# Patient Record
Sex: Male | Born: 1945 | Race: White
Health system: Northeastern US, Academic
[De-identification: ages and names within clinical notes are randomized; demographics above are authoritative.]

## PROBLEM LIST (undated history)

## (undated) DIAGNOSIS — E785 Hyperlipidemia, unspecified: Secondary | ICD-10-CM

## (undated) DIAGNOSIS — I714 Abdominal aortic aneurysm, without rupture, unspecified: Secondary | ICD-10-CM

## (undated) DIAGNOSIS — I10 Essential (primary) hypertension: Secondary | ICD-10-CM

## (undated) DIAGNOSIS — N189 Chronic kidney disease, unspecified: Secondary | ICD-10-CM

## (undated) DIAGNOSIS — K219 Gastro-esophageal reflux disease without esophagitis: Secondary | ICD-10-CM

## (undated) DIAGNOSIS — I509 Heart failure, unspecified: Secondary | ICD-10-CM

## (undated) DIAGNOSIS — J449 Chronic obstructive pulmonary disease, unspecified: Secondary | ICD-10-CM

## (undated) HISTORY — DX: Abdominal aortic aneurysm, without rupture: I71.4

## (undated) HISTORY — DX: Essential (primary) hypertension: I10

## (undated) HISTORY — DX: Abdominal aortic aneurysm, without rupture, unspecified: I71.40

## (undated) HISTORY — DX: Hyperlipidemia, unspecified: E78.5

## (undated) HISTORY — DX: Chronic kidney disease, unspecified: N18.9

## (undated) HISTORY — DX: Gastro-esophageal reflux disease without esophagitis: K21.9

## (undated) HISTORY — DX: Chronic obstructive pulmonary disease, unspecified: J44.9

## (undated) HISTORY — DX: Heart failure, unspecified: I50.9

---

## 2018-04-19 ENCOUNTER — Other Ambulatory Visit: Payer: Self-pay | Admitting: Adult Health

## 2018-04-19 DIAGNOSIS — E049 Nontoxic goiter, unspecified: Secondary | ICD-10-CM

## 2018-04-27 ENCOUNTER — Other Ambulatory Visit: Payer: Self-pay | Admitting: Adult Health

## 2018-04-27 ENCOUNTER — Ambulatory Visit
Admission: RE | Admit: 2018-04-27 | Discharge: 2018-04-27 | Disposition: A | Discharge status: Home / Self Care | Payer: Medicare Other | Source: Ambulatory Visit | Attending: Adult Health | Admitting: Adult Health

## 2018-04-27 DIAGNOSIS — E049 Nontoxic goiter, unspecified: Secondary | ICD-10-CM

## 2018-04-27 DIAGNOSIS — I714 Abdominal aortic aneurysm, without rupture, unspecified: Secondary | ICD-10-CM

## 2018-09-24 ENCOUNTER — Other Ambulatory Visit: Payer: Self-pay | Admitting: Family Medicine

## 2018-09-24 DIAGNOSIS — I719 Aortic aneurysm of unspecified site, without rupture: Secondary | ICD-10-CM

## 2018-09-24 DIAGNOSIS — I739 Peripheral vascular disease, unspecified: Secondary | ICD-10-CM

## 2018-09-27 ENCOUNTER — Other Ambulatory Visit (HOSPITAL_COMMUNITY): Payer: Self-pay | Admitting: Family Medicine

## 2018-09-27 DIAGNOSIS — I2510 Atherosclerotic heart disease of native coronary artery without angina pectoris: Secondary | ICD-10-CM

## 2018-10-08 ENCOUNTER — Ambulatory Visit (HOSPITAL_COMMUNITY)
Admission: RE | Admit: 2018-10-08 | Discharge: 2018-10-08 | Disposition: A | Discharge status: Home / Self Care | Payer: Medicare Other | Source: Ambulatory Visit | Attending: Adult Health | Admitting: Adult Health

## 2018-10-08 ENCOUNTER — Ambulatory Visit
Admission: RE | Admit: 2018-10-08 | Discharge: 2018-10-08 | Disposition: A | Discharge status: Home / Self Care | Payer: Medicare Other | Source: Ambulatory Visit | Attending: Family Medicine | Admitting: Family Medicine

## 2018-10-08 DIAGNOSIS — I739 Peripheral vascular disease, unspecified: Secondary | ICD-10-CM

## 2018-10-08 DIAGNOSIS — I119 Hypertensive heart disease without heart failure: Secondary | ICD-10-CM | POA: Insufficient documentation

## 2018-10-08 DIAGNOSIS — I2510 Atherosclerotic heart disease of native coronary artery without angina pectoris: Secondary | ICD-10-CM | POA: Diagnosis not present

## 2018-10-08 DIAGNOSIS — I719 Aortic aneurysm of unspecified site, without rupture: Secondary | ICD-10-CM

## 2018-10-08 MED ORDER — IOPAMIDOL (ISOVUE-370) INJECTION 76%
80.0000 mL | Freq: Once | INTRAVENOUS | Status: AC | PRN
  Administered 2018-10-08: 80 mL via INTRAVENOUS

## 2018-10-08 NOTE — Progress Notes (Signed)
  Echocardiogram 2D Echocardiogram has been performed.  Jennette Dubin 10/08/2018, 8:49 AM

## 2019-02-16 ENCOUNTER — Emergency Department (HOSPITAL_COMMUNITY): Payer: Medicare Other

## 2019-02-16 ENCOUNTER — Encounter (HOSPITAL_COMMUNITY): Payer: Self-pay | Admitting: Pharmacy Technician

## 2019-02-16 ENCOUNTER — Other Ambulatory Visit: Payer: Self-pay

## 2019-02-16 ENCOUNTER — Emergency Department (HOSPITAL_COMMUNITY)
Admission: EM | Admit: 2019-02-16 | Discharge: 2019-02-16 | Disposition: A | Discharge status: Home / Self Care | Payer: Medicare Other | Attending: Emergency Medicine | Admitting: Emergency Medicine

## 2019-02-16 DIAGNOSIS — R918 Other nonspecific abnormal finding of lung field: Secondary | ICD-10-CM | POA: Insufficient documentation

## 2019-02-16 DIAGNOSIS — I714 Abdominal aortic aneurysm, without rupture, unspecified: Secondary | ICD-10-CM

## 2019-02-16 DIAGNOSIS — R079 Chest pain, unspecified: Secondary | ICD-10-CM | POA: Diagnosis present

## 2019-02-16 LAB — CBC
HCT: 36.7 % — ABNORMAL LOW (ref 39.0–52.0)
Hemoglobin: 11.8 g/dL — ABNORMAL LOW (ref 13.0–17.0)
MCH: 29.9 pg (ref 26.0–34.0)
MCHC: 32.2 g/dL (ref 30.0–36.0)
MCV: 92.9 fL (ref 80.0–100.0)
Platelets: 336 10*3/uL (ref 150–400)
RBC: 3.95 MIL/uL — ABNORMAL LOW (ref 4.22–5.81)
RDW: 14.6 % (ref 11.5–15.5)
WBC: 14 10*3/uL — ABNORMAL HIGH (ref 4.0–10.5)
nRBC: 0 % (ref 0.0–0.2)

## 2019-02-16 LAB — BASIC METABOLIC PANEL
Anion gap: 14 (ref 5–15)
BUN: 25 mg/dL — ABNORMAL HIGH (ref 8–23)
CO2: 21 mmol/L — ABNORMAL LOW (ref 22–32)
Calcium: 9.6 mg/dL (ref 8.9–10.3)
Chloride: 103 mmol/L (ref 98–111)
Creatinine, Ser: 1.47 mg/dL — ABNORMAL HIGH (ref 0.61–1.24)
GFR calc Af Amer: 54 mL/min — ABNORMAL LOW (ref >60)
GFR calc non Af Amer: 47 mL/min — ABNORMAL LOW (ref >60)
Glucose, Bld: 97 mg/dL (ref 70–99)
Potassium: 4.7 mmol/L (ref 3.5–5.1)
Sodium: 138 mmol/L (ref 135–145)

## 2019-02-16 LAB — TROPONIN I: Troponin I: <0.03 ng/mL (ref <0.03)

## 2019-02-16 LAB — I-STAT CREATININE, ED: Creatinine, Ser: 1.4 mg/dL — ABNORMAL HIGH (ref 0.61–1.24)

## 2019-02-16 MED ORDER — MORPHINE SULFATE 15 MG PO TABS: 15.0000 mg | ORAL_TABLET | ORAL | 0 refills | Status: DC | PRN

## 2019-02-16 MED ORDER — IOHEXOL 350 MG/ML SOLN
100.0000 mL | Freq: Once | INTRAVENOUS | Status: AC | PRN
  Administered 2019-02-16: 100 mL via INTRAVENOUS

## 2019-02-16 MED ORDER — MORPHINE SULFATE (PF) 2 MG/ML IV SOLN
2.0000 mg | Freq: Once | INTRAVENOUS | Status: AC
  Administered 2019-02-16: 2 mg via INTRAVENOUS
  Filled 2019-02-16: qty 1

## 2019-02-16 MED ORDER — SODIUM CHLORIDE 0.9 % IV BOLUS
500.0000 mL | Freq: Once | INTRAVENOUS | Status: AC
  Administered 2019-02-16: 500 mL via INTRAVENOUS

## 2019-02-16 MED ORDER — ONDANSETRON HCL 4 MG/2ML IJ SOLN
4.0000 mg | Freq: Once | INTRAMUSCULAR | Status: AC
  Administered 2019-02-16: 16:00:00 4 mg via INTRAVENOUS
  Filled 2019-02-16: qty 2

## 2019-02-16 MED ORDER — ONDANSETRON 4 MG PO TBDP: 4.0000 mg | ORAL_TABLET | Freq: Three times a day (TID) | ORAL | 0 refills | Status: AC | PRN

## 2019-02-16 MED ORDER — SODIUM CHLORIDE 0.9% FLUSH
3.0000 mL | Freq: Once | INTRAVENOUS | Status: AC
  Administered 2019-02-16: 3 mL via INTRAVENOUS

## 2019-02-16 MED ORDER — ALUM & MAG HYDROXIDE-SIMETH 200-200-20 MG/5ML PO SUSP
15.0000 mL | Freq: Once | ORAL | Status: AC
  Administered 2019-02-16: 16:00:00 15 mL via ORAL
  Filled 2019-02-16: qty 30

## 2019-02-16 NOTE — ED Provider Notes (Signed)
[facility] EMERGENCY DEPARTMENT Provider Note   CSN: [id number] Arrival date & time: 02/16/19  1426    History   Chief Complaint Chief Complaint  Patient presents with  . Chest Pain    HPI Roberto Pham is a 73 y.o. male.     73 yo M with a chief complaint of left-sided chest pain.  Described as sharp.  Radiates to his left scapula.  Pain in his left of midline.  Worse with movement palpation twisting deep breathing.  He denies trauma.  States it hurts worse when he works in the yard.  Denies shortness of breath with this denies diaphoresis denies nausea or vomiting.  Patient has a known 7 cm AAA.  Patient states that it suprarenal.  He called his cardiologist who thought that this may be the cause of his symptoms.  Pain is been going on for about a month and worsening over the past week.  Denies trauma.  Denies cough congestion or fever.  Patient has a significant history of smoking and is trying to quit and has been smoking off and on.  The history is provided by the patient.  Chest Pain  Pain location:  L chest Pain quality: sharp and shooting   Radiates to: Left scapula. Pain severity:  Moderate Onset quality:  Gradual Timing:  Constant Progression:  Worsening Chronicity:  New Worsened by:  Deep breathing, certain positions and movement Ineffective treatments:  None tried Associated symptoms: no abdominal pain, no fever, no headache, no palpitations, no shortness of breath and no vomiting     History reviewed. No pertinent past medical history.  There are no active problems to display for this patient.   History reviewed. No pertinent surgical history.      Home Medications    Prior to Admission medications   Medication Sig Start Date End Date Taking? Authorizing Provider  morphine (MSIR) 15 MG tablet Take 1 tablet (15 mg total) by mouth every 4 (four) hours as needed for severe pain. 02/16/19   Deno Etienne, DO  ondansetron (ZOFRAN ODT) 4 MG  disintegrating tablet Take 1 tablet (4 mg total) by mouth every 8 (eight) hours as needed for nausea or vomiting. 02/16/19   Deno Etienne, DO    Family History No family history on file.  Social History Social History   Tobacco Use  . Smoking status: Not on file  Substance Use Topics  . Alcohol use: Not on file  . Drug use: Not on file     Allergies   Patient has no known allergies.   Review of Systems Review of Systems  Constitutional: Negative for chills and fever.  HENT: Negative for congestion and facial swelling.   Eyes: Negative for discharge and visual disturbance.  Respiratory: Negative for shortness of breath.   Cardiovascular: Positive for chest pain. Negative for palpitations.  Gastrointestinal: Negative for abdominal pain, diarrhea and vomiting.  Musculoskeletal: Negative for arthralgias and myalgias.  Skin: Negative for color change and rash.  Neurological: Negative for tremors, syncope and headaches.  Psychiatric/Behavioral: Negative for confusion and dysphoric mood.     Physical Exam Updated Vital Signs BP (!) 165/92   Pulse 87   Temp 98.4 F (36.9 C) (Oral)   Resp (!) 26   Ht 5\' 4"  (1.626 m)   Wt 59 kg   SpO2 95%   BMI 22.31 kg/m   Physical Exam Vitals signs and nursing note reviewed.  Constitutional:      Appearance: He is  well-developed.  HENT:     Head: Normocephalic and atraumatic.  Eyes:     Pupils: Pupils are equal, round, and reactive to light.  Neck:     Musculoskeletal: Normal range of motion and neck supple.     Vascular: No JVD.  Cardiovascular:     Rate and Rhythm: Normal rate and regular rhythm.     Heart sounds: No murmur. No friction rub. No gallop.   Pulmonary:     Effort: No respiratory distress.     Breath sounds: No wheezing.  Abdominal:     General: There is no distension.     Tenderness: There is no guarding or rebound.  Musculoskeletal: Normal range of motion.  Skin:    Coloration: Skin is not pale.      Findings: No rash.  Neurological:     Mental Status: He is alert and oriented to person, place, and time.  Psychiatric:        Behavior: Behavior normal.      ED Treatments / Results  Labs (all labs ordered are listed, but only abnormal results are displayed) Labs Reviewed  BASIC METABOLIC PANEL - Abnormal; Notable for the following components:      Result Value   CO2 21 (*)    BUN 25 (*)    Creatinine, Ser 1.47 (*)    GFR calc non Af Amer 47 (*)    GFR calc Af Amer 54 (*)    All other components within normal limits  CBC - Abnormal; Notable for the following components:   WBC 14.0 (*)    RBC 3.95 (*)    Hemoglobin 11.8 (*)    HCT 36.7 (*)    All other components within normal limits  I-STAT CREATININE, ED - Abnormal; Notable for the following components:   Creatinine, Ser 1.40 (*)    All other components within normal limits  TROPONIN I    EKG EKG Interpretation  Date/Time:  Saturday Feb 16 2019 14:41:11 EDT Ventricular Rate:  97 PR Interval:  150 QRS Duration: 76 QT Interval:  342 QTC Calculation: 434 R Axis:   78 Text Interpretation:  Normal sinus rhythm Normal ECG no STEMI, nonspecific inferior ST depression, no old comparison Confirmed by Charlesetta Shanks [phone]) on 02/16/2019 2:55:37 PM   Radiology Dg Chest 2 View  Result Date: 02/16/2019 CLINICAL DATA:  Worsening chest pain. EXAM: CHEST - 2 VIEW COMPARISON:  April 27, 2018 FINDINGS: Opacity in the posterior left upper lobe is stable, best seen on the lateral view. Scarring seen in the apices. No other acute abnormalities. IMPRESSION: Chronic changes in the lungs with no acute abnormality noted. Electronically Signed   By: Dorise Bullion III M.D   On: 02/16/2019 15:29   Ct Angio Chest/abd/pel For Dissection W And/or Wo Contrast  Result Date: 02/16/2019 CLINICAL DATA:  Thoracoabdominal aortic aneurysm EXAM: CT ANGIOGRAPHY CHEST, ABDOMEN AND PELVIS TECHNIQUE: Multidetector CT imaging through the chest, abdomen  and pelvis was performed using the standard protocol during bolus administration of intravenous contrast. Multiplanar reconstructed images and MIPs were obtained and reviewed to evaluate the vascular anatomy. CONTRAST:  155mL OMNIPAQUE IOHEXOL 350 MG/ML SOLN COMPARISON:  10/08/2018 FINDINGS: CTA CHEST FINDINGS Cardiovascular: Ectasia of the descending thoracic aorta measuring 3.1 cm maximally. Atherosclerotic irregularity. Scattered coronary artery calcifications. Heart is normal size. No filling defects in the pulmonary arteries to suggest pulmonary emboli. Mediastinum/Nodes: Enlarged left hilar lymph nodes measuring 1.4 cm. No mediastinal, right hilar or axillary adenopathy. Lungs/Pleura: Emphysema. There  is a pleural base mass noted in the posterior left upper lobe which measures 5.4 x 3.3 cm on image 24. There is associated bone destruction in the posterior left 4th rib. The mass extends inferiorly toward the left superior hilum. Findings most concerning for primary lung cancer. Biapical scarring. No effusions. Musculoskeletal: Bone destruction in the posterior left 4th rib as described above associated with the pleural base mass. Review of the MIP images confirms the above findings. CTA ABDOMEN AND PELVIS FINDINGS VASCULAR Aorta: Complex abdominal aortic aneurysm measuring 8.0 x 7.2 cm. When measured in the same planes on the prior study, this previously measures 7.5 x 7.0 cm, therefore enlarging. Extensive mural thrombus/plaque. This tapers at the aortic bifurcation where it measures 3.7 cm. Celiac: Atherosclerotic plaque at the origin with mild to moderate narrowing. SMA: Atherosclerotic plaque.  No significant stenosis. 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 Calcified plaque at the origins, left greater than right causing moderate to severe stenosis. IMA: Patent. Atherosclerotic plaque at the origin. The IMA is prominent, likely helping to supply  arterial blood flow to the left lower extremity via collaterals. Inflow: Left common iliac arteries occluded, reconstitutes through internal iliac collaterals. Veins: No obvious venous abnormality within the limitations of this arterial phase study. Review of the MIP images confirms the above findings. NON-VASCULAR Hepatobiliary: No focal hepatic abnormality. Gallbladder unremarkable. Pancreas: No focal abnormality or ductal dilatation. Spleen: No focal abnormality.  Normal size. Adrenals/Urinary Tract: Bilateral renal cysts. No hydronephrosis. Adrenal glands and urinary bladder unremarkable. Stomach/Bowel: Moderate stool throughout the colon. Stomach, large and small bowel grossly unremarkable. Lymphatic: No adenopathy. Reproductive: Mildly prominent prostate. Other: No free fluid or free air. Musculoskeletal: Degenerative changes in the lumbar spine. No acute bony abnormality. Review of the MIP images confirms the above findings. IMPRESSION: Large complex infrarenal abdominal aortic aneurysm measuring 8.0 x 7.2 cm compared with 7.5 x 7.0 cm previously. Extensive mural thrombus/plaque. Irregular posterior pleural base mass in the left upper lobe measures 5.4 x 3.3 cm with resulting bony erosion/destruction in the posterior left 4th rib, and extending toward the left superior hilum with left hilar adenopathy. Findings most compatible with primary lung cancer. This could be further evaluated with PET CT and tissue diagnosis. Emphysema.  Biapical scarring. Coronary artery disease. Moderate stool in the colon. Prostate enlargement. Electronically Signed   By: Rolm Baptise M.D.   On: 02/16/2019 16:55    Procedures Procedures (including critical care time)  Medications Ordered in ED Medications  sodium chloride flush (NS) 0.9 % injection 3 mL (3 mLs Intravenous Given 02/16/19 1602)  morphine 2 MG/ML injection 2 mg (2 mg Intravenous Given 02/16/19 1600)  ondansetron (ZOFRAN) injection 4 mg (4 mg Intravenous Given  02/16/19 1557)  alum & mag hydroxide-simeth (MAALOX/MYLANTA) 200-200-20 MG/5ML suspension 15 mL (15 mLs Oral Given 02/16/19 1556)  sodium chloride 0.9 % bolus 500 mL (500 mLs Intravenous New Bag/Given 02/16/19 1641)  iohexol (OMNIPAQUE) 350 MG/ML injection 100 mL (100 mLs Intravenous Contrast Given 02/16/19 1626)     Initial Impression / Assessment and Plan / ED Course  I have reviewed the triage vital signs and the nursing notes.  Pertinent labs & imaging results that were available during my care of the patient were reviewed by me and considered in my medical decision making (see chart for details).        73 yo M with a chief complaint of left-sided chest pain that radiates to the scapula.  History sounds most like musculoskeletal chest pain however the patient has a known 7 cm AAA.  He is concerned that this is the cause of his symptoms.  Will obtain a CT angiogram to further evaluate.  Lab work including troponin to evaluate for ACS.  The patient's troponin is negative.  The CT angiogram is concerning for slight enlarging of his AAA.  He also has a spiculated lung mass in the left lower lung.  This appears to have eroded through a few ribs which I think is most likely the cause of his pain as he has pains over that region with palpation.  He has no abdominal pain on palpation.  I discussed the case with Dr. Carlis Abbott, vascular surgery he personally reviewed the images as well as the patient's prior notes.  He felt as well that this was unlikely to be the cause of his symptoms.  He did offer to see the patient in follow-up if the patient wanted a second opinion.  I discussed this with the patient will have him follow-up with his family doctor for this lung mass work-up.  CRITICAL CARE Performed by: Cecilio Asper   Total critical care time: 35 minutes  Critical care time was exclusive of separately billable procedures and treating other patients.  Critical care was necessary to treat  or prevent imminent or life-threatening deterioration.  Critical care was time spent personally by me on the following activities: development of treatment plan with patient and/or surrogate as well as nursing, discussions with consultants, evaluation of patient's response to treatment, examination of patient, obtaining history from patient or surrogate, ordering and performing treatments and interventions, ordering and review of laboratory studies, ordering and review of radiographic studies, pulse oximetry and re-evaluation of patient's condition.   5:47 PM:  I have discussed the diagnosis/risks/treatment options with the patient and believe the pt to be eligible for discharge home to follow-up with PCP, vascular surgery. We also discussed returning to the ED immediately if new or worsening sx occur. We discussed the sx which are most concerning (e.g., sudden worsening pain, fever, inability to tolerate by mouth) that necessitate immediate return. Medications administered to the patient during their visit and any new prescriptions provided to the patient are listed below.  Medications given during this visit Medications  sodium chloride flush (NS) 0.9 % injection 3 mL (3 mLs Intravenous Given 02/16/19 1602)  morphine 2 MG/ML injection 2 mg (2 mg Intravenous Given 02/16/19 1600)  ondansetron (ZOFRAN) injection 4 mg (4 mg Intravenous Given 02/16/19 1557)  alum & mag hydroxide-simeth (MAALOX/MYLANTA) 200-200-20 MG/5ML suspension 15 mL (15 mLs Oral Given 02/16/19 1556)  sodium chloride 0.9 % bolus 500 mL (500 mLs Intravenous New Bag/Given 02/16/19 1641)  iohexol (OMNIPAQUE) 350 MG/ML injection 100 mL (100 mLs Intravenous Contrast Given 02/16/19 1626)     The patient appears reasonably screen and/or stabilized for discharge and I doubt any other medical condition or other [facility] requiring further screening, evaluation, or treatment in the ED at this time prior to discharge.    Final Clinical Impressions(s)  / ED Diagnoses   Final diagnoses:  Mass of left lung  Abdominal aortic aneurysm (AAA) greater than 5.5 cm in diameter in male [facility])    ED Discharge Orders         Ordered    morphine (MSIR) 15 MG tablet  Every 4 hours PRN     02/16/19 1733    ondansetron (ZOFRAN ODT) 4 MG disintegrating tablet  Every 8  hours PRN     02/16/19 [place], [place], DO 02/16/19 1747

## 2019-02-16 NOTE — ED Triage Notes (Signed)
Pt arrives via pov with worsening cp over the last few weeks. States pain radiates to his back shoulder blade. Hx AAA.

## 2019-02-16 NOTE — ED Notes (Signed)
Please call wife Ignacia Felling at [phone] to give her an update on her husband.

## 2019-02-16 NOTE — Discharge Instructions (Addendum)
The CT scan today shows that your aneurysm has gotten slightly larger.  I discussed the case with the vascular surgeon here who thinks that your pain is more likely to be due to the lung mass that it is to your aneurysm.  He is willing to see you in the office if you would like a second opinion.  It would also be reasonable to see the vascular surgeon that you had seen previously if you wish to discuss operative repair further.  Please call your family doctor and let them know that we saw the left lung mass that has eroded into a few of your ribs.  This could easily be the cause of your symptoms.  The radiologist is concerned for lung cancer and therefore if you would like this worked up you need to discuss it with your family doctor.    Please return to the emergency department for any worsening pain.  Pain especially to your abdomen would be concerning for pain from the aneurysm.  Take tylenol 1000mg (2 extra strength) four times a day.   Then take the pain medicine if you feel like you need it. Narcotics do not help with the pain, they only make you care about it less.  You can become addicted to this, people may break into your [employer] to steal it.  It will constipate you.  If you drive under the influence of this medicine you can get a DUI.

## 2019-02-21 ENCOUNTER — Encounter: Payer: Self-pay | Admitting: Internal Medicine

## 2019-02-21 ENCOUNTER — Telehealth: Payer: Self-pay | Admitting: Internal Medicine

## 2019-02-21 ENCOUNTER — Telehealth: Payer: Self-pay | Admitting: Medical Oncology

## 2019-02-21 NOTE — Telephone Encounter (Signed)
Patient was referred yesterday and Seth Bake will be calling him with an appt early next week.  Thanks Hinton Dyer

## 2019-02-21 NOTE — Telephone Encounter (Signed)
Asking for appt to see Dr Julien Nordmann. Recent imaging- "pleural based mass with bone destruction Left 4th rib"  Referral is in  Chart. Please call [name] [phone].

## 2019-02-21 NOTE — Telephone Encounter (Signed)
Lft voicemails on both the pt and sgo regarding an appt w/Dr. Julien Nordmann on 6/8 at 2:15pm w/labs at 1:45pm. Will mail a letter.

## 2019-02-22 ENCOUNTER — Other Ambulatory Visit: Payer: Self-pay | Admitting: *Deleted

## 2019-02-22 DIAGNOSIS — R918 Other nonspecific abnormal finding of lung field: Secondary | ICD-10-CM

## 2019-02-25 ENCOUNTER — Other Ambulatory Visit: Payer: Self-pay

## 2019-02-25 ENCOUNTER — Telehealth: Payer: Self-pay | Admitting: Medical Oncology

## 2019-02-25 ENCOUNTER — Inpatient Hospital Stay: Payer: Medicare Other | Attending: Internal Medicine | Admitting: Internal Medicine

## 2019-02-25 ENCOUNTER — Inpatient Hospital Stay: Payer: Medicare Other

## 2019-02-25 ENCOUNTER — Encounter: Payer: Self-pay | Admitting: Internal Medicine

## 2019-02-25 DIAGNOSIS — Z808 Family history of malignant neoplasm of other organs or systems: Secondary | ICD-10-CM

## 2019-02-25 DIAGNOSIS — R590 Localized enlarged lymph nodes: Secondary | ICD-10-CM

## 2019-02-25 DIAGNOSIS — R918 Other nonspecific abnormal finding of lung field: Secondary | ICD-10-CM

## 2019-02-25 DIAGNOSIS — K5900 Constipation, unspecified: Secondary | ICD-10-CM | POA: Insufficient documentation

## 2019-02-25 DIAGNOSIS — C3490 Malignant neoplasm of unspecified part of unspecified bronchus or lung: Secondary | ICD-10-CM

## 2019-02-25 DIAGNOSIS — Z801 Family history of malignant neoplasm of trachea, bronchus and lung: Secondary | ICD-10-CM

## 2019-02-25 DIAGNOSIS — F17210 Nicotine dependence, cigarettes, uncomplicated: Secondary | ICD-10-CM

## 2019-02-25 NOTE — Progress Notes (Signed)
[place] [phone]   [phone]  CONSULT NOTE  REFERRING PHYSICIAN: Dr. Deno Etienne  REASON FOR CONSULTATION:  73 years old white male with suspicious lung cancer.  HPI Roberto Pham is a 73 y.o. male with past medical history significant for abdominal aortic aneurysm, chronic kidney disease, Hypertension, dyslipidemia as well as long history for smoking.  The patient was complaining of left-sided chest pain for 2 months that was getting worse.  He was taken Tylenol with no improvement.  He presented to the emergency department for evaluation and CT angiogram scan of the chest was performed on 02/16/2019.  The scan showed a pleural-based mass noted in the posterior left lower lobe measured 5.4 x 3.3 cm associated with bone destruction and the posterior left fourth rib.  The mass extends inferiorly towards the left superior hilum and the findings are concerning for primary lung cancer.  There was also enlarged left hilar lymph node measuring 1.4 cm.  There was no mediastinal, right hilar or axillary lymphadenopathy. The patient was referred to me today for further evaluation and recommendation regarding this abnormality. When seen today he continues to have the soreness in the left side of the chest as well as shortness of breath with exertion and cough with no hemoptysis.  He has no nausea, vomiting, diarrhea but has constipation.  He denied having any significant weight loss or night sweats.  He continues to have headache with no visual changes. Family history significant for mother with esophageal cancer, sister had lung cancer and father died from massive heart attack. The patient is single and has 6 children.  He has a girlfriend Roberto Pham was available by phone during the visit.  The patient used to work as a Administrator.  He has a history for smoking up to 2 bags per day for around 60 years and unfortunately he continues to smoke.  He has no history of  alcohol or drug abuse.  HPI  Past Medical History:  Diagnosis Date  . Abdominal aortic aneurysm (AAA) ([place])   . CKD (chronic kidney disease)   . Hyperlipidemia   . Hypertension     History reviewed. No pertinent surgical history.  Family History  Problem Relation Age of Onset  . Esophageal cancer Mother   . Heart attack Father   . Lung cancer Sister     Social History Social History   Tobacco Use  . Smoking status: Current Every Day Smoker    Packs/day: 2.00    Types: Cigarettes  . Smokeless tobacco: Never Used  Substance Use Topics  . Alcohol use: Not Currently  . Drug use: Not Currently    No Known Allergies  Current Outpatient Medications  Medication Sig Dispense Refill  . ondansetron (ZOFRAN ODT) 4 MG disintegrating tablet Take 1 tablet (4 mg total) by mouth every 8 (eight) hours as needed for nausea or vomiting. 20 tablet 0   No current facility-administered medications for this visit.     Review of Systems  Constitutional: positive for fatigue Eyes: negative Ears, nose, mouth, throat, and face: negative Respiratory: positive for cough, dyspnea on exertion and pleurisy/chest pain Cardiovascular: negative Gastrointestinal: negative Genitourinary:negative Integument/breast: negative Hematologic/lymphatic: negative Musculoskeletal:negative Neurological: negative Behavioral/Psych: negative Endocrine: negative Allergic/Immunologic: negative  Physical Exam  YOV:ZCHYI, healthy, no distress, well nourished, well developed and anxious SKIN: skin color, texture, turgor are normal, no rashes or significant lesions HEAD: Normocephalic, No masses, lesions, tenderness or abnormalities EYES: normal, PERRLA, Conjunctiva are pink  and non-injected EARS: External ears normal, Canals clear OROPHARYNX:no exudate, no erythema and lips, buccal mucosa, and tongue normal  NECK: supple, no adenopathy, no JVD LYMPH:  no palpable lymphadenopathy, no hepatosplenomegaly  LUNGS: clear to auscultation , and palpation HEART: regular rate & rhythm, no murmurs and no gallops ABDOMEN:abdomen soft, non-tender, normal bowel sounds and no masses or organomegaly BACK: No CVA tenderness, Range of motion is normal EXTREMITIES:no joint deformities, effusion, or inflammation, no edema  NEURO: alert & oriented x 3 with fluent speech, no focal motor/sensory deficits  PERFORMANCE STATUS: ECOG 1  LABORATORY DATA: Lab Results  Component Value Date   WBC 14.0 (H) 02/16/2019   HGB 11.8 (L) 02/16/2019   HCT 36.7 (L) 02/16/2019   MCV 92.9 02/16/2019   PLT 336 02/16/2019      Chemistry      Component Value Date/Time   NA 138 02/16/2019 1439   K 4.7 02/16/2019 1439   CL 103 02/16/2019 1439   CO2 21 (L) 02/16/2019 1439   BUN 25 (H) 02/16/2019 1439   CREATININE 1.40 (H) 02/16/2019 1546      Component Value Date/Time   CALCIUM 9.6 02/16/2019 1439       RADIOGRAPHIC STUDIES: Dg Chest 2 View  Result Date: 02/16/2019 CLINICAL DATA:  Worsening chest pain. EXAM: CHEST - 2 VIEW COMPARISON:  April 27, 2018 FINDINGS: Opacity in the posterior left upper lobe is stable, best seen on the lateral view. Scarring seen in the apices. No other acute abnormalities. IMPRESSION: Chronic changes in the lungs with no acute abnormality noted. Electronically Signed   By: Dorise Bullion III M.D   On: 02/16/2019 15:29   Ct Angio Chest/abd/pel For Dissection W And/or Wo Contrast  Result Date: 02/16/2019 CLINICAL DATA:  Thoracoabdominal aortic aneurysm EXAM: CT ANGIOGRAPHY CHEST, ABDOMEN AND PELVIS TECHNIQUE: Multidetector CT imaging through the chest, abdomen and pelvis was performed using the standard protocol during bolus administration of intravenous contrast. Multiplanar reconstructed images and MIPs were obtained and reviewed to evaluate the vascular anatomy. CONTRAST:  132mL OMNIPAQUE IOHEXOL 350 MG/ML SOLN COMPARISON:  10/08/2018 FINDINGS: CTA CHEST FINDINGS Cardiovascular: Ectasia of  the descending thoracic aorta measuring 3.1 cm maximally. Atherosclerotic irregularity. Scattered coronary artery calcifications. Heart is normal size. No filling defects in the pulmonary arteries to suggest pulmonary emboli. Mediastinum/Nodes: Enlarged left hilar lymph nodes measuring 1.4 cm. No mediastinal, right hilar or axillary adenopathy. Lungs/Pleura: Emphysema. There is a pleural base mass noted in the posterior left upper lobe which measures 5.4 x 3.3 cm on image 24. There is associated bone destruction in the posterior left 4th rib. The mass extends inferiorly toward the left superior hilum. Findings most concerning for primary lung cancer. Biapical scarring. No effusions. Musculoskeletal: Bone destruction in the posterior left 4th rib as described above associated with the pleural base mass. Review of the MIP images confirms the above findings. CTA ABDOMEN AND PELVIS FINDINGS VASCULAR Aorta: Complex abdominal aortic aneurysm measuring 8.0 x 7.2 cm. When measured in the same planes on the prior study, this previously measures 7.5 x 7.0 cm, therefore enlarging. Extensive mural thrombus/plaque. This tapers at the aortic bifurcation where it measures 3.7 cm. Celiac: Atherosclerotic plaque at the origin with mild to moderate narrowing. SMA: Atherosclerotic plaque.  No significant stenosis. 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 Calcified plaque at the origins, left greater than right causing moderate to severe stenosis. IMA: Patent. Atherosclerotic plaque at the origin. The IMA is prominent,  likely helping to supply arterial blood flow to the left lower extremity via collaterals. Inflow: Left common iliac arteries occluded, reconstitutes through internal iliac collaterals. Veins: No obvious venous abnormality within the limitations of this arterial phase study. Review of the MIP images confirms the above findings. NON-VASCULAR Hepatobiliary: No focal  hepatic abnormality. Gallbladder unremarkable. Pancreas: No focal abnormality or ductal dilatation. Spleen: No focal abnormality.  Normal size. Adrenals/Urinary Tract: Bilateral renal cysts. No hydronephrosis. Adrenal glands and urinary bladder unremarkable. Stomach/Bowel: Moderate stool throughout the colon. Stomach, large and small bowel grossly unremarkable. Lymphatic: No adenopathy. Reproductive: Mildly prominent prostate. Other: No free fluid or free air. Musculoskeletal: Degenerative changes in the lumbar spine. No acute bony abnormality. Review of the MIP images confirms the above findings. IMPRESSION: Large complex infrarenal abdominal aortic aneurysm measuring 8.0 x 7.2 cm compared with 7.5 x 7.0 cm previously. Extensive mural thrombus/plaque. Irregular posterior pleural base mass in the left upper lobe measures 5.4 x 3.3 cm with resulting bony erosion/destruction in the posterior left 4th rib, and extending toward the left superior hilum with left hilar adenopathy. Findings most compatible with primary lung cancer. This could be further evaluated with PET CT and tissue diagnosis. Emphysema.  Biapical scarring. Coronary artery disease. Moderate stool in the colon. Prostate enlargement. Electronically Signed   By: Rolm Baptise M.D.   On: 02/16/2019 16:55    ASSESSMENT: This is a very pleasant 73 years old white male with highly suspicious stage IIIA (T3, N1, M0) lung cancer pending further staging work-up and biopsy.  He presented with pleural-based left upper lobe mass in addition to right hilar adenopathy.   PLAN: I had a lengthy discussion with the patient and his girlfriend today about his current disease condition and further investigation to confirm his diagnosis and treatment options. I recommended for the patient to complete the staging work-up by ordering a PET scan as well as MRI of the brain to rule out any metastatic disease. We will arrange for the patient to have CT-guided core biopsy  of the left upper lobe pleural-based mass for confirmation of tissue diagnosis. I will arrange for the patient to come back for follow-up visit in 2 weeks for further evaluation and recommendation regarding treatment of his condition based on the final pathology and work-up. He was strongly advised to quit smoking. The patient was also advised to call immediately if he has any other concerning symptoms in the interval. The patient voices understanding of current disease status and treatment options and is in agreement with the current care plan.  All questions were answered. The patient knows to call the clinic with any problems, questions or concerns. We can certainly see the patient much sooner if necessary.  Thank you so much for allowing me to participate in the care of Roberto Pham. I will continue to follow up the patient with you and assist in his care.  I spent 40 minutes counseling the patient face to face. The total time spent in the appointment was 60 minutes.  Disclaimer: This note was dictated with voice recognition software. Similar sounding words can inadvertently be transcribed and may not be corrected upon review.   Eilleen Kempf February 25, 2019, 2:51 PM

## 2019-02-25 NOTE — Telephone Encounter (Signed)
  Does he need labs today? 5/30-Had CBC , bmet -

## 2019-02-25 NOTE — Telephone Encounter (Signed)
NO labs today per [facility].

## 2019-02-26 ENCOUNTER — Telehealth: Payer: Self-pay | Admitting: Medical Oncology

## 2019-02-26 NOTE — Telephone Encounter (Signed)
LVM with [facility] these recommendations.

## 2019-02-26 NOTE — Telephone Encounter (Signed)
Pain

## 2019-02-26 NOTE — Telephone Encounter (Signed)
He can use Ibuprofen in addition to Hydrocodone. He may reach out to his PCP who did Rxed his pain medicine.

## 2019-02-26 NOTE — Telephone Encounter (Signed)
Pain Management-  Requests better pain management. Hydrocodone not effective for pain relief. He is taking 1 tablet every 4 hours prn as prescribed and he is taking them every 4 hours. June 1st PCP prescribed  #120 -has 42 left.

## 2019-02-27 ENCOUNTER — Telehealth: Payer: Self-pay | Admitting: Internal Medicine

## 2019-02-27 NOTE — Telephone Encounter (Signed)
Scheduled appt per 6/05 los - unable to reach pt . Left message with appt date and time

## 2019-03-04 ENCOUNTER — Other Ambulatory Visit: Payer: Self-pay

## 2019-03-04 ENCOUNTER — Ambulatory Visit (HOSPITAL_COMMUNITY)
Admission: RE | Admit: 2019-03-04 | Discharge: 2019-03-04 | Disposition: A | Discharge status: Home / Self Care | Payer: Medicare Other | Source: Ambulatory Visit | Attending: Internal Medicine | Admitting: Internal Medicine

## 2019-03-04 DIAGNOSIS — R918 Other nonspecific abnormal finding of lung field: Secondary | ICD-10-CM | POA: Diagnosis not present

## 2019-03-04 DIAGNOSIS — I700 Atherosclerosis of aorta: Secondary | ICD-10-CM | POA: Diagnosis not present

## 2019-03-04 DIAGNOSIS — I714 Abdominal aortic aneurysm, without rupture: Secondary | ICD-10-CM | POA: Insufficient documentation

## 2019-03-04 DIAGNOSIS — C3490 Malignant neoplasm of unspecified part of unspecified bronchus or lung: Secondary | ICD-10-CM | POA: Insufficient documentation

## 2019-03-04 DIAGNOSIS — J439 Emphysema, unspecified: Secondary | ICD-10-CM | POA: Insufficient documentation

## 2019-03-04 DIAGNOSIS — Z79899 Other long term (current) drug therapy: Secondary | ICD-10-CM | POA: Insufficient documentation

## 2019-03-04 LAB — GLUCOSE, CAPILLARY: Glucose-Capillary: 80 mg/dL (ref 70–99)

## 2019-03-04 IMAGING — PT NUCLEAR MEDICINE PET IMAGE INITIAL (PI) SKULL BASE TO THIGH
1 series · 6 of 6 positions shown · non-contrast
Comparison: Chest abdomen pelvis CT angiography 02/16/2019

CLINICAL DATA: Initial treatment strategy for non-small-cell lung
cancer.

EXAM:
NUCLEAR MEDICINE PET SKULL BASE TO THIGH
TECHNIQUE: 6.4 mCi F-18 FDG was injected intravenously. Full-ring PET imaging
was performed from the skull base to thigh after the radiotracer. CT
data was obtained and used for attenuation correction and anatomic
localization.
Fasting blood glucose: 80 mg/dl

[Series 1076: results mm oncology reading · 1.0mm · 0.51mm/px · 6 of 6 slices shown]
[im 1/6]
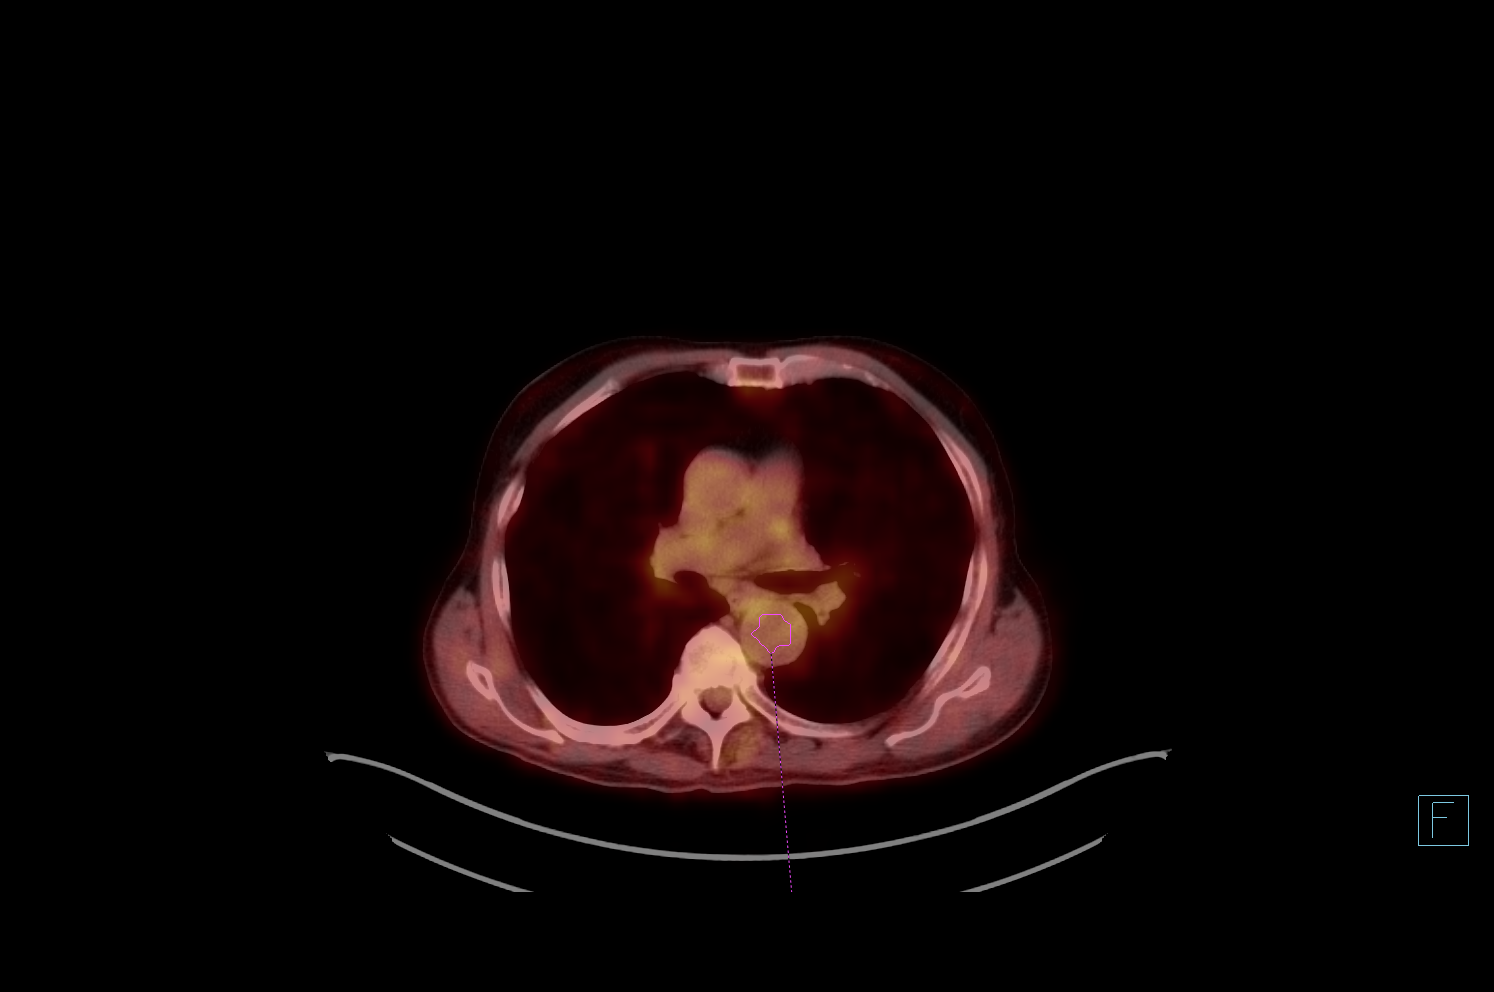
[im 2/6]
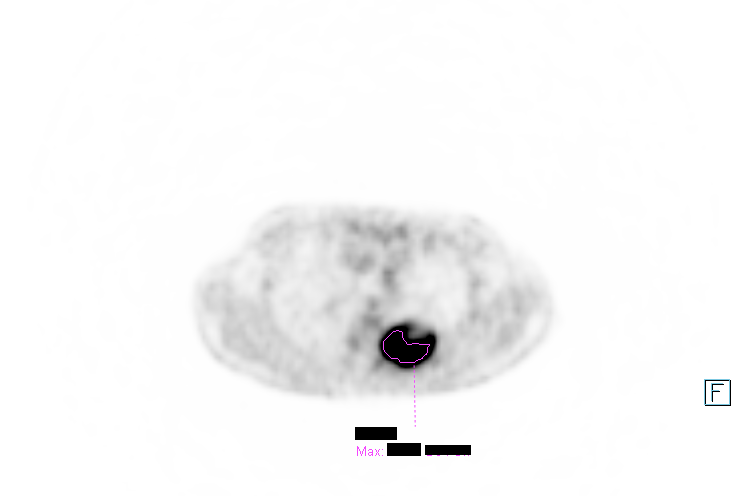
[im 3/6]
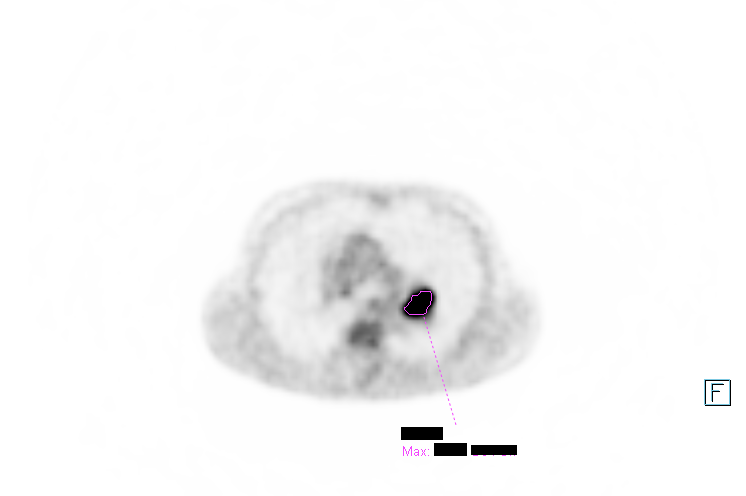
[im 4/6]
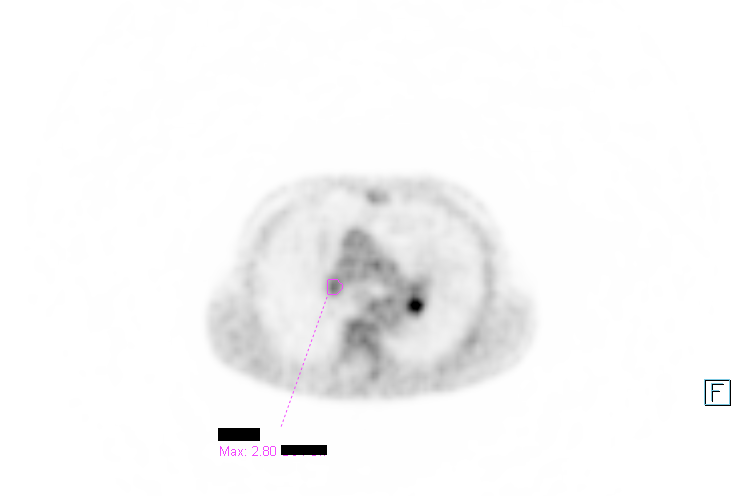
[im 5/6]
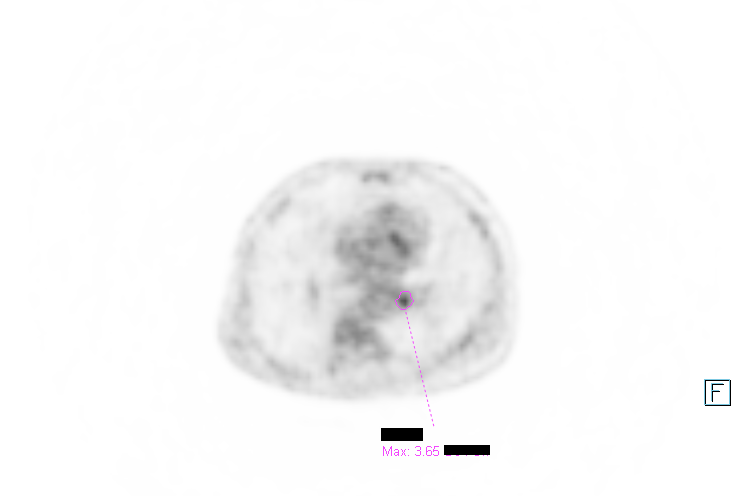
[im 6/6]
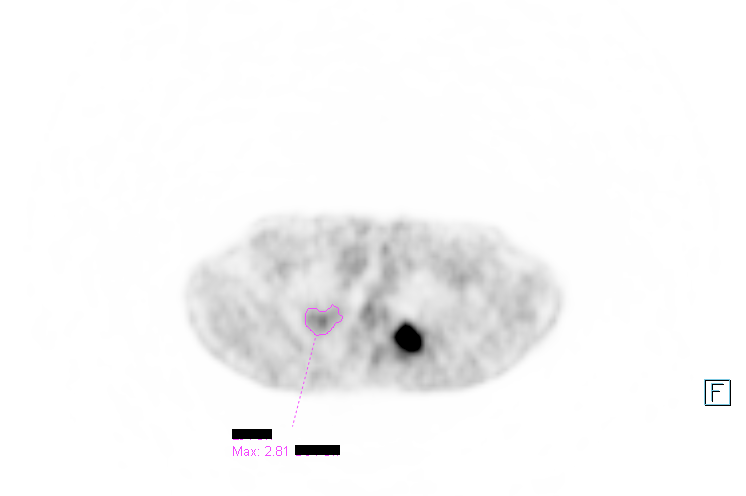

[6 of 6 positions shown; findings below may reference images not displayed]

FINDINGS: Mediastinal blood pool activity: SUV max

Liver activity: SUV max NA

NECK: No hypermetabolic lymph nodes in the neck.

Incidental CT findings: none

CHEST: Posterior left upper lobe lesion measures 5.2 x 3.7 cm today
with chest wall invasion and bony erosion of the posterior fourth
rib. There is also cortical involvement of the posterior left third
rib. Lesion is markedly hypermetabolic with SUV max = 20. Tumor
extends centrally along the major fissure with cavitary component
extending into the superior left hilum. Superior left hilar and
suprahilar disease demonstrates SUV max = 12.8. 10 mm nodule in the
inferior left hilum (84/4 and visualized on previous CT ([DATE]) is
hypermetabolic with SUV max = 3.7.

Low level activity is identified in the right hilum although
discrete lymph nodes are not evident, but features are concerning
for metastatic involvement in the right hilum.

Pleuroparenchymal opacity in the posterior right apex shows low
level FDG accumulation with SUV max = 2.8.

Incidental CT findings: Coronary artery calcification is evident.
Atherosclerotic calcification is noted in the wall of the thoracic
aorta. Centrilobular emphsyema noted.

ABDOMEN/PELVIS: No abnormal hypermetabolic activity within the
liver, pancreas, adrenal glands, or spleen. No hypermetabolic lymph
nodes in the abdomen or pelvis.

Incidental CT findings: Irregular complex infrarenal abdominal
aortic aneurysm measures 8.1 x 6.0 cm today which compares to 8.0 x
5.8 cm when I remeasure it in a similar fashion on the prior study.
Small exophytic cyst noted upper pole right kidney. Small left groin
hernia contains only fat.

SKELETON: Diffuse mottled FDG uptake noted in the bony anatomy
without definite osseous metastatic lesion evident.

Incidental CT findings: Old right transverse process fracture noted
at L1. As above, there is cortical involvement of the posterior left
third rib and destruction of the posterior left fourth rib.
IMPRESSION: 1. Markedly hypermetabolic posterior left upper lobe lung mass,
consistent with the patient's known neoplasm. Hypermetabolic soft
tissue tracks from the dominant lesion centrally into the superior
left hilum.
2. 10 mm nodule in the inferior left hilum may be a lymph node or
central pulmonary lesion. This is hypermetabolic and may reflect
metastatic disease although synchronous primary cannot be entirely
excluded. Dedicated CT chest with contrast may prove helpful to
further evaluate as clinically warranted.
3. Focal uptake in the right hilar region without underlying
discrete lymph nodes evident on CT imaging. Metastatic disease not
excluded.
4. Probable pleuroparenchymal scarring posterior right apex shows
low level FDG accumulation. As neoplasm cannot be entirely excluded,
close attention on follow-up recommended.
5. Complex infrarenal abdominal aortic aneurysm measuring up to
cm. Vascular surgery consultation recommended due to increased risk
of rupture for AAA >5.5 cm. This recommendation follows ACR
consensus guidelines: White Paper of the ACR Incidental Findings
Committee II on Vascular Findings. [HOSPITAL] [id number];
[DATE]. Aortic aneurysm NOS ([facility]-6LU.P)
6.  Aortic Atherosclerois (HHUIC-170.0)
7.  Emphysema. ([facility]-KH9.6)

## 2019-03-04 MED ORDER — GADOBUTROL 1 MMOL/ML IV SOLN
5.0000 mL | Freq: Once | INTRAVENOUS | Status: AC | PRN
  Administered 2019-03-04: 5 mL via INTRAVENOUS

## 2019-03-04 MED ORDER — FLUDEOXYGLUCOSE F - 18 (FDG) INJECTION
6.3800 | Freq: Once | INTRAVENOUS | Status: AC | PRN
  Administered 2019-03-04: 6.38 via INTRAVENOUS

## 2019-03-05 ENCOUNTER — Telehealth: Payer: Self-pay | Admitting: *Deleted

## 2019-03-05 NOTE — Telephone Encounter (Signed)
Received call from pt's significant other, [name] requesting to change pt's appt from 6/23 to 6/22 as they are moving on 6/23. Scheduling request sent

## 2019-03-06 ENCOUNTER — Telehealth: Payer: Self-pay | Admitting: Internal Medicine

## 2019-03-06 ENCOUNTER — Telehealth (HOSPITAL_COMMUNITY): Payer: Self-pay

## 2019-03-06 NOTE — Telephone Encounter (Signed)
Per 6/16 schedule message. Moved 6/23 f/u to 6/22. Confirmed with relative.

## 2019-03-08 ENCOUNTER — Encounter: Payer: Self-pay | Admitting: *Deleted

## 2019-03-08 ENCOUNTER — Telehealth (HOSPITAL_COMMUNITY): Payer: Self-pay

## 2019-03-08 NOTE — Progress Notes (Signed)
I called central scheduling to get biopsy ordered.  I was updated it is in review with the radiology team and after review, they will call patient with an appt.

## 2019-03-08 NOTE — Progress Notes (Signed)
Oncology Nurse Navigator Documentation  Oncology Nurse Navigator Flowsheets 03/08/2019  Navigator Location [facility]  Navigator Encounter Type Other/Dr. Julien Nordmann saw patient last week.  His treatment plan is work up and biopsy.  Patient has PET and MRI brain, with CT biopsy ordered.  I will reach out to radiology to get scheduled.   Abnormal Finding Date 02/16/2019  Treatment Phase Abnormal Scans  Interventions Other  Acuity Level 1  Time Spent with Patient 15

## 2019-03-11 ENCOUNTER — Inpatient Hospital Stay (HOSPITAL_BASED_OUTPATIENT_CLINIC_OR_DEPARTMENT_OTHER): Payer: Medicare Other | Admitting: Internal Medicine

## 2019-03-11 ENCOUNTER — Telehealth: Payer: Self-pay | Admitting: *Deleted

## 2019-03-11 ENCOUNTER — Other Ambulatory Visit: Payer: Self-pay

## 2019-03-11 ENCOUNTER — Encounter: Payer: Self-pay | Admitting: Internal Medicine

## 2019-03-11 VITALS — BP 123/85 | HR 87 | Temp 98.7°F | Resp 18 | Ht 64.0 in | Wt 126.6 lb

## 2019-03-11 DIAGNOSIS — R918 Other nonspecific abnormal finding of lung field: Secondary | ICD-10-CM | POA: Diagnosis not present

## 2019-03-11 DIAGNOSIS — K5900 Constipation, unspecified: Secondary | ICD-10-CM

## 2019-03-11 DIAGNOSIS — C3490 Malignant neoplasm of unspecified part of unspecified bronchus or lung: Secondary | ICD-10-CM

## 2019-03-11 NOTE — Telephone Encounter (Signed)
Received call from pt's wife. She states she has talked to her husband about the radiation Oncology consultation and he is now illing to do that. Please call his wife @ [phone] to schedule the appt.

## 2019-03-11 NOTE — Progress Notes (Signed)
[facility] [facility] [phone]   [phone]  OFFICE PROGRESS NOTE  Buzzy Han, MD [place] [id number]  DIAGNOSIS: Highly suspicious stage IIIA (T3, N1, M0) lung cancer pending further staging work-up and biopsy.  He presented with pleural-based left upper lobe mass in addition to right hilar adenopathy.  PRIOR THERAPY: None  CURRENT THERAPY: None  INTERVAL HISTORY: Roberto Pham 73 y.o. male returns to the clinic today for follow-up visit.  His wife.  Was available by phone during the visit.  The patient is feeling fine today with no concerning complaints except for pain on the left side of the chest as well as constipation that has been going on for several days.  He takes Colace with no improvement.  He denied having any shortness of breath, cough or hemoptysis.  He denied having any fever or chills.  He has no nausea, vomiting or diarrhea.  He denied having any recent weight loss or night sweats.  He had a PET scan as well as MRI of the brain performed recently and he is here for evaluation and discussion of his treatment options.  MEDICAL HISTORY: Past Medical History:  Diagnosis Date   Abdominal aortic aneurysm (AAA) (HCC)    CKD (chronic kidney disease)    Hyperlipidemia    Hypertension     ALLERGIES:  has No Known Allergies.  MEDICATIONS:  Current Outpatient Medications  Medication Sig Dispense Refill   aspirin 81 MG chewable tablet Chew 1 tablet by mouth daily.     atorvastatin (LIPITOR) 80 MG tablet Take 1 tablet by mouth daily.     carvedilol (COREG) 3.125 MG tablet Take 1 tablet by mouth 2 (two) times daily.     HYDROcodone-acetaminophen (NORCO/VICODIN) 5-325 MG tablet Take 1 tablet by mouth 4 (four) times daily as needed.     lisinopril (ZESTRIL) 30 MG tablet Take 2 tablets by mouth 2 (two) times daily.     NEXIUM 40 MG capsule Take 1 tablet by mouth daily.     ondansetron (ZOFRAN ODT) 4  MG disintegrating tablet Take 1 tablet (4 mg total) by mouth every 8 (eight) hours as needed for nausea or vomiting. 20 tablet 0   No current facility-administered medications for this visit.     SURGICAL HISTORY: No past surgical history on file.  REVIEW OF SYSTEMS:  Constitutional: positive for fatigue Eyes: negative Ears, nose, mouth, throat, and face: negative Respiratory: positive for pleurisy/chest pain Cardiovascular: negative Gastrointestinal: positive for constipation Genitourinary:negative Integument/breast: negative Hematologic/lymphatic: negative Musculoskeletal:negative Neurological: negative Behavioral/Psych: negative Endocrine: negative Allergic/Immunologic: negative   PHYSICAL EXAMINATION: General appearance: alert, cooperative, fatigued and no distress Head: Normocephalic, without obvious abnormality, atraumatic Neck: no adenopathy, no JVD, supple, symmetrical, trachea midline and thyroid not enlarged, symmetric, no tenderness/mass/nodules Lymph nodes: Cervical, supraclavicular, and axillary nodes normal. Resp: clear to auscultation bilaterally Back: symmetric, no curvature. ROM normal. No CVA tenderness. Cardio: regular rate and rhythm, S1, S2 normal, no murmur, click, rub or gallop GI: soft, non-tender; bowel sounds normal; no masses,  no organomegaly Extremities: extremities normal, atraumatic, no cyanosis or edema Neurologic: Alert and oriented X 3, normal strength and tone. Normal symmetric reflexes. Normal coordination and gait  ECOG PERFORMANCE STATUS: 1 - Symptomatic but completely ambulatory  Blood pressure 123/85, pulse 87, temperature 98.7 F (37.1 C), temperature source Oral, resp. rate 18, height 5\' 4"  (1.626 m), weight 126 lb 9.6 oz (57.4 kg), SpO2 96 %.  LABORATORY DATA: Lab Results  Component Value Date   WBC 14.0 (H) 02/16/2019   HGB 11.8 (L) 02/16/2019   HCT 36.7 (L) 02/16/2019   MCV 92.9 02/16/2019   PLT 336 02/16/2019      Chemistry       Component Value Date/Time   NA 138 02/16/2019 1439   K 4.7 02/16/2019 1439   CL 103 02/16/2019 1439   CO2 21 (L) 02/16/2019 1439   BUN 25 (H) 02/16/2019 1439   CREATININE 1.40 (H) 02/16/2019 1546      Component Value Date/Time   CALCIUM 9.6 02/16/2019 1439       RADIOGRAPHIC STUDIES: Dg Chest 2 View  Result Date: 02/16/2019 CLINICAL DATA:  Worsening chest pain. EXAM: CHEST - 2 VIEW COMPARISON:  April 27, 2018 FINDINGS: Opacity in the posterior left upper lobe is stable, best seen on the lateral view. Scarring seen in the apices. No other acute abnormalities. IMPRESSION: Chronic changes in the lungs with no acute abnormality noted. Electronically Signed   By: Dorise Bullion III M.D   On: 02/16/2019 15:29   [name] Contrast  Result Date: 03/04/2019 CLINICAL DATA:  Left lung mass. Staging of non-small cell carcinoma. EXAM: MRI HEAD WITHOUT AND WITH CONTRAST TECHNIQUE: Multiplanar, multiecho pulse sequences of the brain and surrounding structures were obtained without and with intravenous contrast. CONTRAST:  5 mL Gadavist COMPARISON:  None. FINDINGS: BRAIN: There is no acute infarct, acute hemorrhage or extra-axial collection. The midline structures are normal. No midline shift or other mass effect. Early confluent hyperintense T2-weighted signal of the periventricular and deep white matter, most commonly due to chronic ischemic microangiopathy. The cerebral and cerebellar volume are age-appropriate. No hydrocephalus. Susceptibility-sensitive sequences show no chronic microhemorrhage or superficial siderosis. No abnormal contrast enhancement. VASCULAR: The major intracranial arterial and venous sinus flow voids are normal. SKULL AND UPPER CERVICAL SPINE: Calvarial bone marrow signal is normal. There is no skull base mass. Visualized upper cervical spine and soft tissues are normal. SINUSES/ORBITS: No fluid levels or advanced mucosal thickening. No mastoid or middle ear effusion. The  orbits are normal. IMPRESSION: 1. No intracranial metastatic disease. 2. Chronic small vessel ischemia without acute abnormality. Electronically Signed   By: Ulyses Jarred M.D.   On: 03/04/2019 16:57   Nm Pet Image Initial (pi) Skull Base To Thigh  Result Date: 03/05/2019 CLINICAL DATA:  Initial treatment strategy for non-small-cell lung cancer. EXAM: NUCLEAR MEDICINE PET SKULL BASE TO THIGH TECHNIQUE: 6.4 mCi F-18 FDG was injected intravenously. Full-ring PET imaging was performed from the skull base to thigh after the radiotracer. CT data was obtained and used for attenuation correction and anatomic localization. Fasting blood glucose: 80 mg/dl COMPARISON:  Chest abdomen pelvis CT angiography 02/16/2019 FINDINGS: Mediastinal blood pool activity: SUV max 2.4 Liver activity: SUV max NA NECK: No hypermetabolic lymph nodes in the neck. Incidental CT findings: none CHEST: Posterior left upper lobe lesion measures 5.2 x 3.7 cm today with chest wall invasion and bony erosion of the posterior fourth rib. There is also cortical involvement of the posterior left third rib. Lesion is markedly hypermetabolic with SUV max = 20. Tumor extends centrally along the major fissure with cavitary component extending into the superior left hilum. Superior left hilar and suprahilar disease demonstrates SUV max = 12.8. 10 mm nodule in the inferior left hilum (84/4 and visualized on previous CT (17/5) is hypermetabolic with SUV max = 3.7. Low level activity is identified in the right hilum although discrete lymph nodes are not  evident, but features are concerning for metastatic involvement in the right hilum. Pleuroparenchymal opacity in the posterior right apex shows low level FDG accumulation with SUV max = 2.8. Incidental CT findings: Coronary artery calcification is evident. Atherosclerotic calcification is noted in the wall of the thoracic aorta. Centrilobular emphsyema noted. ABDOMEN/PELVIS: No abnormal hypermetabolic activity  within the liver, pancreas, adrenal glands, or spleen. No hypermetabolic lymph nodes in the abdomen or pelvis. Incidental CT findings: Irregular complex infrarenal abdominal aortic aneurysm measures 8.1 x 6.0 cm today which compares to 8.0 x 5.8 cm when I remeasure it in a similar fashion on the prior study. Small exophytic cyst noted upper pole right kidney. Small left groin hernia contains only fat. SKELETON: Diffuse mottled FDG uptake noted in the bony anatomy without definite osseous metastatic lesion evident. Incidental CT findings: Old right transverse process fracture noted at L1. As above, there is cortical involvement of the posterior left third rib and destruction of the posterior left fourth rib. IMPRESSION: 1. Markedly hypermetabolic posterior left upper lobe lung mass, consistent with the patient's known neoplasm. Hypermetabolic soft tissue tracks from the dominant lesion centrally into the superior left hilum. 2. 10 mm nodule in the inferior left hilum may be a lymph node or central pulmonary lesion. This is hypermetabolic and may reflect metastatic disease although synchronous primary cannot be entirely excluded. Dedicated CT chest with contrast may prove helpful to further evaluate as clinically warranted. 3. Focal uptake in the right hilar region without underlying discrete lymph nodes evident on CT imaging. Metastatic disease not excluded. 4. Probable pleuroparenchymal scarring posterior right apex shows low level FDG accumulation. As neoplasm cannot be entirely excluded, close attention on follow-up recommended. 5. Complex infrarenal abdominal aortic aneurysm measuring up to 8.1 cm. Vascular surgery consultation recommended due to increased risk of rupture for AAA >5.5 cm. This recommendation follows ACR consensus guidelines: White Paper of the ACR Incidental Findings Committee II on Vascular Findings. J Am Coll Radiol 2013; 10:789-794. Aortic aneurysm NOS (ICD10-I71.9) 6.  Aortic Atherosclerois  (ICD10-170.0) 7.  Emphysema. (XNA35-T73.9) Electronically Signed   By: Misty Stanley M.D.   On: 03/05/2019 09:59   Ct Angio Chest/abd/pel For Dissection W And/or Wo Contrast  Result Date: 02/16/2019 CLINICAL DATA:  Thoracoabdominal aortic aneurysm EXAM: CT ANGIOGRAPHY CHEST, ABDOMEN AND PELVIS TECHNIQUE: Multidetector CT imaging through the chest, abdomen and pelvis was performed using the standard protocol during bolus administration of intravenous contrast. Multiplanar reconstructed images and MIPs were obtained and reviewed to evaluate the vascular anatomy. CONTRAST:  143mL OMNIPAQUE IOHEXOL 350 MG/ML SOLN COMPARISON:  10/08/2018 FINDINGS: CTA CHEST FINDINGS Cardiovascular: Ectasia of the descending thoracic aorta measuring 3.1 cm maximally. Atherosclerotic irregularity. Scattered coronary artery calcifications. Heart is normal size. No filling defects in the pulmonary arteries to suggest pulmonary emboli. Mediastinum/Nodes: Enlarged left hilar lymph nodes measuring 1.4 cm. No mediastinal, right hilar or axillary adenopathy. Lungs/Pleura: Emphysema. There is a pleural base mass noted in the posterior left upper lobe which measures 5.4 x 3.3 cm on image 24. There is associated bone destruction in the posterior left 4th rib. The mass extends inferiorly toward the left superior hilum. Findings most concerning for primary lung cancer. Biapical scarring. No effusions. Musculoskeletal: Bone destruction in the posterior left 4th rib as described above associated with the pleural base mass. Review of the MIP images confirms the above findings. CTA ABDOMEN AND PELVIS FINDINGS VASCULAR Aorta: Complex abdominal aortic aneurysm measuring 8.0 x 7.2 cm. When measured in the same planes on  the prior study, this previously measures 7.5 x 7.0 cm, therefore enlarging. Extensive mural thrombus/plaque. This tapers at the aortic bifurcation where it measures 3.7 cm. Celiac: Atherosclerotic plaque at the origin with mild to  moderate narrowing. SMA: Atherosclerotic plaque.  No significant stenosis. 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 Calcified plaque at the origins, left greater than right causing moderate to severe stenosis. IMA: Patent. Atherosclerotic plaque at the origin. The IMA is prominent, likely helping to supply arterial blood flow to the left lower extremity via collaterals. Inflow: Left common iliac arteries occluded, reconstitutes through internal iliac collaterals. Veins: No obvious venous abnormality within the limitations of this arterial phase study. Review of the MIP images confirms the above findings. NON-VASCULAR Hepatobiliary: No focal hepatic abnormality. Gallbladder unremarkable. Pancreas: No focal abnormality or ductal dilatation. Spleen: No focal abnormality.  Normal size. Adrenals/Urinary Tract: Bilateral renal cysts. No hydronephrosis. Adrenal glands and urinary bladder unremarkable. Stomach/Bowel: Moderate stool throughout the colon. Stomach, large and small bowel grossly unremarkable. Lymphatic: No adenopathy. Reproductive: Mildly prominent prostate. Other: No free fluid or free air. Musculoskeletal: Degenerative changes in the lumbar spine. No acute bony abnormality. Review of the MIP images confirms the above findings. IMPRESSION: Large complex infrarenal abdominal aortic aneurysm measuring 8.0 x 7.2 cm compared with 7.5 x 7.0 cm previously. Extensive mural thrombus/plaque. Irregular posterior pleural base mass in the left upper lobe measures 5.4 x 3.3 cm with resulting bony erosion/destruction in the posterior left 4th rib, and extending toward the left superior hilum with left hilar adenopathy. Findings most compatible with primary lung cancer. This could be further evaluated with PET CT and tissue diagnosis. Emphysema.  Biapical scarring. Coronary artery disease. Moderate stool in the colon. Prostate enlargement. Electronically Signed   By:  Rolm Baptise M.D.   On: 02/16/2019 16:55    ASSESSMENT AND PLAN: This is a very pleasant 73 years old white male with a stage IIIA/IIIB lung cancer pending patient diagnosed and presented with left upper lobe lung mass with destruction of the posterior left fourth rib and extension towards the left superior hilum and left hilar adenopathy. I had a lengthy discussion with the patient and his wife about his current disease condition and treatment options.  I personally and independently reviewed the scan images and discussed the result and showed the images to the patient today. I recommended for the patient to have CT-guided core biopsy of the left upper lobe mass by interventional radiology. I will also refer the patient to radiation oncology for evaluation and consideration of a course of radiotherapy concurrent with chemotherapy. I will arrange for the patient a follow-up appointment with me after the biopsy for more detailed discussion of his chemotherapy option.  For constipation, the patient was advised to use milk of magnesia initially followed by MiraLAX and Colace. There was advised to call immediately if he has any other concerning symptoms in the interval. The patient voices understanding of current disease status and treatment options and is in agreement with the current care plan.  All questions were answered. The patient knows to call the clinic with any problems, questions or concerns. We can certainly see the patient much sooner if necessary.  I spent 15 minutes counseling the patient face to face. The total time spent in the appointment was 25 minutes.  Disclaimer: This note was dictated with voice recognition software. Similar sounding words can inadvertently be transcribed and may not be corrected upon review.

## 2019-03-11 NOTE — Telephone Encounter (Signed)
Called patient to schedule appointment for a consultation with Dr. Sondra Come and he refused.

## 2019-03-12 ENCOUNTER — Telehealth: Payer: Self-pay | Admitting: Internal Medicine

## 2019-03-12 ENCOUNTER — Telehealth: Payer: Self-pay | Admitting: Medical Oncology

## 2019-03-12 ENCOUNTER — Other Ambulatory Visit: Payer: Self-pay | Admitting: Internal Medicine

## 2019-03-12 ENCOUNTER — Telehealth: Payer: Self-pay | Admitting: *Deleted

## 2019-03-12 ENCOUNTER — Ambulatory Visit: Payer: Medicare Other | Admitting: Internal Medicine

## 2019-03-12 DIAGNOSIS — R918 Other nonspecific abnormal finding of lung field: Secondary | ICD-10-CM

## 2019-03-12 NOTE — Telephone Encounter (Signed)
Scheduled appt per 6/22 los - unable to reach pt . Left message with appt date and time

## 2019-03-12 NOTE — Telephone Encounter (Signed)
Oncology Nurse Navigator Documentation  Oncology Nurse Navigator Flowsheets 03/12/2019  Navigator Location [facility]  Navigator Encounter Type Other/I received a message from [place] that patient is moving to [place] on July 8 and will get treatment there.  I called and spoke with wife.  She states yes they are going.  I asked who he will be seen and she didn't know.  I asked once she knows who they will be seeing I can get records to them.  I gave her phone number to call when she has more information.  She verbalized understanding.   Abnormal Finding Date -  Treatment Phase Abnormal Scans  Interventions Other  Acuity Level 2  Time Spent with Patient 30

## 2019-03-12 NOTE — Telephone Encounter (Signed)
Pt moving on  July 8th to [place] and needs referral to MD in [place], [place] or [place].   Cancel appts-She is cancelling all his appts.and diagnostic testing "because they will do it all in [place]".

## 2019-03-12 NOTE — Telephone Encounter (Signed)
Dr. Dulcy Fanny at [facility] and [place] at [facility], [place].  I have placed the referral.

## 2019-03-13 NOTE — Telephone Encounter (Signed)
Called to [facility] [place].

## 2019-03-14 ENCOUNTER — Ambulatory Visit: Payer: Medicare Other | Admitting: Radiation Oncology

## 2019-03-14 ENCOUNTER — Ambulatory Visit: Payer: Medicare Other

## 2019-03-15 ENCOUNTER — Ambulatory Visit (HOSPITAL_COMMUNITY): Payer: Medicare Other

## 2019-03-19 ENCOUNTER — Telehealth: Payer: Self-pay | Admitting: Internal Medicine

## 2019-03-19 NOTE — Telephone Encounter (Signed)
Faxed records to Dr. Darrall Dears in [place].

## 2019-03-20 ENCOUNTER — Ambulatory Visit (HOSPITAL_COMMUNITY): Payer: Medicare Other

## 2019-03-20 ENCOUNTER — Encounter (HOSPITAL_COMMUNITY): Payer: Self-pay

## 2019-03-25 ENCOUNTER — Ambulatory Visit: Payer: Medicare Other | Admitting: Physician Assistant

## 2019-04-03 ENCOUNTER — Encounter: Payer: Self-pay | Admitting: *Deleted

## 2019-04-03 ENCOUNTER — Telehealth: Payer: Self-pay | Admitting: *Deleted

## 2019-04-03 NOTE — Telephone Encounter (Signed)
Oncology Nurse Navigator Documentation  Oncology Nurse Navigator Flowsheets 04/03/2019  Navigator Location [facility]  Navigator Encounter Type Telephone/I called Mr. Drost to follow up on how he is doing in [place] and his cancer care.  I was unable to reach but did leave a vm message for him to call with my name and phone number.   Telephone Outgoing Call  Abnormal Finding Date -  Treatment Phase Abnormal Scans  Barriers/Navigation Needs Education  Education Other  Interventions Education  Education Method Verbal  Acuity Level 1  Time Spent with Patient 15

## 2019-04-03 NOTE — Progress Notes (Signed)
Oncology Nurse Navigator Documentation  Oncology Nurse Navigator Flowsheets 04/03/2019  Navigator Location [facility]  Navigator Encounter Type Telephone/I received a call from patient's wife stating he has not been seen due to the office in [facility] has not gotten PET scan and MRI.  I called [place] where Dr. Julien Nordmann made referral on 03/12/2019.  They have Mr. Garofano in the system but not seeing anyone.  I was transferred to new patient referral office and left a vm message leaving my name and phone number to call me to help with appt for patient.    Telephone Incoming Call;Outgoing Call  Abnormal Finding Date -  Treatment Phase Abnormal Scans  Barriers/Navigation Needs Coordination of Care  Education Other  Interventions Coordination of Care  Coordination of Care Other  Education Method Verbal  Acuity Level 2  Time Spent with Patient 30

## 2019-04-04 ENCOUNTER — Other Ambulatory Visit: Payer: Self-pay | Admitting: Gastroenterology

## 2019-04-04 ENCOUNTER — Inpatient Hospital Stay: Admit: 2019-04-04 | Discharge: 2019-04-04 | Disposition: A | Discharge status: Home / Self Care | Payer: Self-pay

## 2019-04-04 ENCOUNTER — Encounter: Payer: Self-pay | Admitting: *Deleted

## 2019-04-04 NOTE — Progress Notes (Signed)
Oncology Nurse Navigator Documentation  Oncology Nurse Navigator Flowsheets 04/04/2019  Navigator Location [facility]  Navigator Encounter Type Telephone/I received a call from [facility] regarding Mr. Capri.  I was updated that they were able to speak to Mr. Whetstine and he states he lives in [place].  They have also spoken to the wife and states that they live in [place] wanted some clarification and called me.  I update that it was my understanding patient moved to [place] on 03/27/2019.  Dr. Chancy Milroy will be taking referral and has records but needs CD of scans.  I updated Dr. Julien Nordmann and he request to be sent. I notified radiology of scans needed.   Telephone Incoming Call  Abnormal Finding Date -  Treatment Phase Abnormal Scans  Barriers/Navigation Needs Coordination of Care  Education -  Interventions Coordination of Care  Coordination of Care Other  Education Method -  Acuity Level 2  Time Spent with Patient 30

## 2019-04-05 ENCOUNTER — Encounter: Payer: Self-pay | Admitting: *Deleted

## 2019-04-05 NOTE — Progress Notes (Signed)
Oncology Nurse Navigator Documentation  Oncology Nurse Navigator Flowsheets 04/05/2019  Navigator Location [facility]  Navigator Encounter Type Telephone/I received a call from [place] at [employer] at [facility] in [place] this am.  I updated her that patient's CD of his scans were Fedex yesterday.  She was thankful for the information.  She did tell me that she got a phone call from an ED 3 hours from her facility that the patient presented with coughing up blood.  The patient was told he needed a biopsy then treatment but he refused and left the hospital.  Vania Rea stated she will continue to try and get him come in to see Med Onc.    Telephone Incoming Call;Outgoing Call  Abnormal Finding Date -  Treatment Phase Abnormal Scans  Barriers/Navigation Needs Education  Education Other  Interventions Education  Coordination of Care -  Education Method Verbal  Acuity Level 1  Time Spent with Patient 15

## 2019-05-01 ENCOUNTER — Encounter: Payer: Self-pay | Admitting: Gastroenterology

## 2019-05-03 ENCOUNTER — Telehealth: Payer: Self-pay | Admitting: *Deleted

## 2019-05-03 NOTE — Telephone Encounter (Signed)
Received call from pt's girlfriend asking about pt records and CD of pt scans to be sent to [facility] in [place], [place] near [place], [place] She states that this cancer center called yesterday about records and she is following up about that call. Spoke to [place] in [place] and she states she did get that call yesterday and will be following up on that. She will provide # to Radiology so they can request CD of scans  Contact # for [facility] is [phone]  TCT [name] and made her aware of the above.

## 2019-05-11 ENCOUNTER — Other Ambulatory Visit: Payer: Self-pay

## 2019-05-14 NOTE — Patient Instructions (Addendum)
Your team:  Lovie Chol, MD, Windell Moulding, NP and your nurse is Othella Boyer, RN.      Please call the [place] Triage Phone with any questions or concerns, [phone].  This number is open 24 hrs, 7 days a week.  Your call will be routed to your nurse between 8am-5pm, and an on-call physician will return your call evenings, nights, weekends and holidays.      If you need to cancel or change an appointment, please call [phone]    Please allow five days for team to complete any disability paperwork.     SOCIAL WORK: Erin Sons [phone]    NUTRITION: Mikle Bosworth [phone]    Monday [address] Gwendolyn Fill [facility]  Tuesday [place] Gwendolyn Fill [facility]  Wednesday [address] Gwendolyn Fill [facility]  Thursday Clinic- Second Floor [address] Toney Sang        August 2020      'Sunday Monday Tuesday Wednesday Thursday Friday Saturday                                 1       2     3     4     5     6     7     8       9     10     11     12     13     14     15       16     17     18     19     20     21     22       23     24     25     26    NEW PATIENT VISIT  10:40 AM   (40 min.)   Patel, Arpan, MD   [facility] 27     28     29       30     31                                          September 2020      Sunday Monday Tuesday Wednesday Thursday Friday Saturday             1     2     3     4     5       6     7     8     9     10     11     12       13     14     15     16     17     18     19       20     21     22     23     24     25     26'$   $'27     28     29     30                                'K$ October 2020      'Sunday Monday Tuesday Wednesday Thursday Friday Saturday                       1     2     3       4     5     6     7     8     9     10       11     12     13     14     15     16     17       18     19     20     21     22     23     24       25     26     27     28     29     30     31                 November  2020      Sunday Monday Tuesday Wednesday Thursday Friday Saturday   1     2     3     4     5     6     7       8     9     10     11     12     13     14       15     16     17     18     19     20     21       22     23     24     25     26     27     28       29     18 September 2019      Sunday Monday Tuesday Wednesday Thursday Friday Saturday             1     2     3     4     5       6     7     8     9     10     11     12       13     14     15     16     17     18     19       20'$   $'21     22     23     24     25     26       27     28     29     30     31                           'I$ January 2021      'Sunday Monday Tuesday Wednesday Thursday Friday Saturday                            1     2       3     4     5     6     7     8     9       10     11     12     13     14     15     16       17     18     19     20     21     22     23       24     25     26     27     28     29     30       31'$

## 2019-05-14 NOTE — Progress Notes (Signed)
[facility] - Outpatient Initial Consultation Note    Name: Richard Weaver     MRN: [id number]  Date of Birth: [birth year]    Referring Provider/PCP: Dr. Sherolyn Weaver  PCP: Richard Auerbach, MD  Rad/Onc: None  CardioThoracic Surgery: None  Pulmonary: None    Diagnosis/Stage: Likely at least stage IIIA lung cancer, no biopsy done yet  ECOG Performance Status: (3) Capable of limited self-care, confined to bed or chair > 50% of waking hours    Oncologic History  12/2018 - left shoulder blade pain started, declined ED evaluation.   02/16/19 - ED visit for left-sided chest pain. CTA Chest showed irregular posterior pleural base LUL mass (5.4 x 3.3 cm) with post left 4th rib involvement, extension to left superior hilum and left hilar adenopathy.   03/11/19 - Evaluated by Richard Weaver (Richard Weaver). Was recommended to have biopsy of LUL mass, with plans for referral to Radiation Oncology. Note stated "highly suspicious stage IIIA (T3, N1, M0)."  03/2019 - ED Melina Fiddler) visit for self-limited hemoptysis. Was treated with course of Augmentin. Declined hospital admission.   05/15/19- NPV here at [facility]    CC: Left chest and left upper back pain    History of Present Illness:  Richard Weaver is a 73 y.o. male with a PMHx of HTN, HLD, COPD, CAD, HFrEF, GERD, CKD3, Anemia, hx of polio, LBP, anxiety, insomnia, migraines, PVD, left iliac aa thrombosis, large AAA (8x7.2cm, declined surgery), cigarette smoking (50 PYHx), hx basal cell cancer, hx SqCC left hand who presents for evaluation of recently diagnosed left upper lung mass with bony involvement.  He is here with his significant other, Richard Weaver, today in the office.    The patient was having left shoulder blade pain around April 2020 but declined to go to ED at that time. Patient then presented to ED 02/16/19 for left-sided chest pain. CTA Chest done showed irregular posterior pleural base LUL mass (5.4 x 3.3 cm) with post left 4th rib  involvement, with extension to left superior hilum and left hilar adenopathy. Imaging highly suspicious for lung cancer, concerning for at least stage IIIA. Evaluated by Richard Weaver. on 03/11/19 (Richard Weaver), and he was recommended to have biopsy of LUL mass, plans for referral to Radiation Oncology.  He had ED visit for hemoptysis 03/2019, which self resolved.  He was discharged with a course of antibiotics at that time though he is unsure why. Patient then decided to move back to [place] state and establish care here.  They report a perceived delay in their care as they had initially been trying to the [facility] cancer program but there were issues with paperwork.   Patient states that he has not had any biopsy done.  He reports a 20 to 30 pound weight loss over the past 3 months with physical deconditioning.  Reports that his legs will go numb when he tries to stand and walk for any prolonged period of time, which limits his activity.  He believes this is due to his vascular issues and had previously been following with vascular surgery.  He has a large known AAA, which he says he will deal with after his lung cancer treatment is established.  He notes a poor appetite though eats a good variety of foods including high-protein.  His #1 issue at this point is left-sided chest pain and left upper back pain, for which he has been taking morphine  30 mg ER twice daily.  He was previously on short acting opiates but is no longer taking these.  He has dealt with opiate-induced constipation, for which she says milk of magnesia works well.  Reports dry mouth, which often wakes him up at night despite using a humidifier.  He notes a history of polio when he was much younger, though up until last year he was very functional and is able to be completely independent.  He remained seated for much of the day currently, and is able to meet his ADLs but struggles to do so.  He notes that his sister had lung cancer  with chemotherapy, and after seeing her struggling with this he would not ever want chemotherapy.  He says he would consider radiation therapy if needed.  He reports some skin irritation on his buttocks from sitting for prolonged periods of time, though denies any skin breakdown or open wounds.  Denies significant shortness of breath, diarrhea, palpitations, leg swelling.  Does have an occasional cough that is productive of white to yellow sputum.  He has had no hematemesis since last month.   Socially, patient is a former Administrator. Orginially from [place], recently moved back to [place] state from [place]. Likes to work on cars but has not been able to do this much lately.  Reports extensive cigarette smoking history, smoking 1 to 2 packs/day since his mid 29s, though recently down to 4 to 5/day.  Denies alcohol or illicit drug use history.  Has been living with his significant other, Richard Weaver.     Review of Systems:  Review of Systems   Constitutional: Positive for malaise/fatigue and weight loss. Negative for chills and fever.   HENT:        Dry mouth   Respiratory: Positive for cough and sputum production (white-yellow). Negative for hemoptysis and shortness of breath.    Cardiovascular: Positive for chest pain (left-sided) and claudication. Negative for palpitations, orthopnea and leg swelling.   Gastrointestinal: Positive for constipation. Negative for abdominal pain, diarrhea, nausea and vomiting.   Genitourinary: Negative.  Negative for dysuria.   Musculoskeletal: Positive for joint pain (left shoulder/chest). Negative for falls.   Skin:        Irritation on buttocks   Neurological: Positive for sensory change (legs go numb when walking) and weakness (general). Negative for dizziness, tremors, speech change, seizures, loss of consciousness and headaches.   Psychiatric/Behavioral: Negative for hallucinations and substance abuse.     Past Medical History  Past Medical History:   Diagnosis Date    AAA  (abdominal aortic aneurysm)     CHF (congestive heart failure)     Chronic kidney disease     COPD (chronic obstructive pulmonary disease)     GERD (gastroesophageal reflux disease)     Hypertension      Social History  Social History     Socioeconomic History    Marital status: Single     Spouse name: Not on file    Number of children: Not on file    Years of education: Not on file    Highest education level: Not on file   Occupational History    Not on file   Tobacco Use    Smoking status: Current Every Day Smoker     Packs/day: 1.50     Years: 50.00     Pack years: 75.00     Types: Cigarettes    Smokeless tobacco: Never Used  Tobacco comment: Currently down to 4-5 per day.    Substance and Sexual Activity    Alcohol use: Not Currently     Frequency: Never    Drug use: Never    Sexual activity: Not on file   Social History Narrative    Not on file     Past Surgical History  History reviewed. No pertinent surgical history.    Family History  Cancer-related family history includes Cancer in his mother and sister.    Medications  Current Outpatient Medications on File Prior to Visit   Medication Sig Dispense Refill    aspirin 81 MG EC tablet Take 81 mg by mouth daily      atorvastatin (LIPITOR) 80 MG tablet Take 80 mg by mouth daily      carvedilol (COREG) 3.125 MG tablet Take 3.125 mg by mouth 2 times daily (with meals)      esomeprazole (NEXIUM) 40 MG capsule Take 40 mg by mouth daily (before breakfast)      lisinopril (PRINIVIL,ZESTRIL) 30 MG tablet Take 30 mg by mouth daily      budesonide-formoterol (SYMBICORT) 80-4.5 MCG/ACT inhaler Inhale 2 puffs into the lungs 2 times daily Shake well before each use.      Lidocaine 4 % patch Place 1 patch onto the skin every 24 hours Remove & discard patch within 12 hours or as directed.       No current facility-administered medications on file prior to visit.      Allergy:  Allergy History as of 05/15/19      No Known Allergies (drug, envir, food  or latex)              Vitals  BP 102/61 (BP Location: Left arm)    Pulse 76    Temp 36.4 C (97.5 F) (Temporal)    Resp 17    Ht 165 cm (5' 4.96")    Wt 49 kg (108 lb)    SpO2 99%    BMI 17.99 kg/m     Physical Exam   Constitutional: He is oriented to person, place, and time. He appears well-developed. No distress.   Thin   HENT:   Head: Normocephalic.   Mouth/Throat: Oropharynx is clear and moist. No oropharyngeal exudate.   Eyes: Conjunctivae and EOM are normal. Right eye exhibits no discharge. Left eye exhibits no discharge. No scleral icterus.   Neck: Normal range of motion. Neck supple.   Cardiovascular: Normal rate, regular rhythm and normal heart sounds. Exam reveals no gallop and no friction rub.   No murmur heard.  Pulmonary/Chest: Effort normal. He has rales (left upper lung field). He exhibits tenderness (left chest wall, left upper back).   Abdominal: Soft. Bowel sounds are normal. He exhibits no distension. There is no abdominal tenderness. There is no guarding.   Musculoskeletal: Normal range of motion.         General: No edema.      Comments: Thin bilateral lower legs.   Neurological: He is alert and oriented to person, place, and time.   Gait not assessed. Thin BLE, using wheelchair.   Skin: Skin is warm and dry. No rash noted. He is not diaphoretic. No erythema.   Psychiatric: He has a normal mood and affect. His behavior is normal. Judgment and thought content normal.     Laboratory:   None in our system  From scanned records (03/2019): Na 134, K 4.8, SCr 1.7, WBC 13.3, Hgb 11.2, PLT 318, LFT wnl  Radiology: imaging from scanned studies  CTA Chest 530/20:      MRI Brain 03/04/19: no intracranial metastasis    PET 03/05/19:      Pathology  - None available     Impression  Brennyn Haisley Gallant is a 73 y.o. male with a PMHx of HTN, HLD, COPD, CAD, HFrEF, GERD, CKD3, Anemia, hx of polio, LBP, anxiety, insomnia, migraines, PVD, left iliac aa thrombosis, large AAA (8x7.2cm, declined surgery), cigarette  smoking (50 PYHx), hx basal cell cancer, hx SqCC left hand who presents for evaluation of recently diagnosed left upper lung mass with bony involvement.  Patient will require biopsy and further imaging to determine his treatment options.    Recommendation    1. Left upper lung mass, likely lung cancer at least stage IIIA with bony involvement. No biopsy done yet.  - Labs ordered: CBC, CMP, coags, Mg  - Pulmonoogy urgent referral for bronch/EBUS biopsy for diagnosis  - PET-CT ordered  - MRI Head ordered  - Pain control: refilled morphine ER '30mg'$  BID. Started oxycodone '5mg'$  Q4H PRN.    2. Heme Status  - Labs ordered: CBC, CMP, coags, Mg  - No abnormalities on CBC last month    3. ID status  - No signs/symptoms of infection.    [place] ordered: CBC, CMP, coags, Mg  - Poor appetite, weight loss, likely malnutrition    5. Tobacco abuse  - Provided counseling, education, and moral support on smoking cessation. Active smoking during treatment could negatively impact outcome.  Patient has cut back to 4 to 5 cigarettes/day.    6. AAA (8x7.2cm), PVD  - Had recently declined surgical intervention.  Prefers to deal wit lung mass first.h  - Would benefit from continued follow-up with vascular surgery    7. HTN  - Patient currently on lisinopril 30 minutes daily.  Has noted low blood pressure at home recently and in the office today.  - Plans to discuss antihypertensive medication regimen with PCP    8. HFrEF/CAD/HLD  - ASA, statin  -We will likely need to hold aspirin for upcoming biopsy.    9. COPD  - Only taking Sybicort PRN, <1x/week  - No current supplemental oxygen use    10. GERD  - takes esomeprazole     Follow-up in 3 weeks via telemedicine visit.  Patient seen and plan discussed with attending physician, Dr. Posey Pronto.    Matthias Hughs, MD PhD  Internal Medicine, PGY-3

## 2019-05-15 ENCOUNTER — Telehealth: Payer: Self-pay | Admitting: Hematology & Oncology

## 2019-05-15 ENCOUNTER — Encounter: Payer: Self-pay | Admitting: Hematology & Oncology

## 2019-05-15 ENCOUNTER — Ambulatory Visit: Payer: Medicare (Managed Care) | Attending: Hematology & Oncology | Admitting: Hematology & Oncology

## 2019-05-15 ENCOUNTER — Other Ambulatory Visit: Payer: Self-pay

## 2019-05-15 ENCOUNTER — Other Ambulatory Visit
Admission: RE | Admit: 2019-05-15 | Discharge: 2019-05-15 | Disposition: A | Discharge status: Home / Self Care | Payer: Medicare (Managed Care) | Source: Ambulatory Visit

## 2019-05-15 VITALS — BP 102/61 | HR 76 | Temp 97.5°F | Resp 17 | Ht 64.96 in | Wt 108.0 lb

## 2019-05-15 DIAGNOSIS — N189 Chronic kidney disease, unspecified: Secondary | ICD-10-CM | POA: Insufficient documentation

## 2019-05-15 DIAGNOSIS — N179 Acute kidney failure, unspecified: Secondary | ICD-10-CM | POA: Insufficient documentation

## 2019-05-15 DIAGNOSIS — Z720 Tobacco use: Secondary | ICD-10-CM

## 2019-05-15 DIAGNOSIS — E785 Hyperlipidemia, unspecified: Secondary | ICD-10-CM | POA: Insufficient documentation

## 2019-05-15 DIAGNOSIS — J449 Chronic obstructive pulmonary disease, unspecified: Secondary | ICD-10-CM | POA: Insufficient documentation

## 2019-05-15 DIAGNOSIS — R918 Other nonspecific abnormal finding of lung field: Secondary | ICD-10-CM

## 2019-05-15 DIAGNOSIS — K219 Gastro-esophageal reflux disease without esophagitis: Secondary | ICD-10-CM | POA: Insufficient documentation

## 2019-05-15 DIAGNOSIS — I739 Peripheral vascular disease, unspecified: Secondary | ICD-10-CM | POA: Insufficient documentation

## 2019-05-15 DIAGNOSIS — I714 Abdominal aortic aneurysm, without rupture, unspecified: Secondary | ICD-10-CM

## 2019-05-15 DIAGNOSIS — I5020 Unspecified systolic (congestive) heart failure: Secondary | ICD-10-CM | POA: Insufficient documentation

## 2019-05-15 DIAGNOSIS — I10 Essential (primary) hypertension: Secondary | ICD-10-CM | POA: Insufficient documentation

## 2019-05-15 DIAGNOSIS — G43909 Migraine, unspecified, not intractable, without status migrainosus: Secondary | ICD-10-CM | POA: Insufficient documentation

## 2019-05-15 DIAGNOSIS — I509 Heart failure, unspecified: Secondary | ICD-10-CM | POA: Insufficient documentation

## 2019-05-15 DIAGNOSIS — F17200 Nicotine dependence, unspecified, uncomplicated: Secondary | ICD-10-CM | POA: Insufficient documentation

## 2019-05-15 DIAGNOSIS — Z8612 Personal history of poliomyelitis: Secondary | ICD-10-CM | POA: Insufficient documentation

## 2019-05-15 LAB — COMPREHENSIVE METABOLIC PANEL
ALT: 9 U/L (ref 0–50)
AST: 14 U/L (ref 0–50)
Albumin: 3.4 g/dL — ABNORMAL LOW (ref 3.5–5.2)
Alk Phos: 94 U/L (ref 40–130)
Anion Gap: 12 (ref 7–16)
Bilirubin,Total: 0.3 mg/dL (ref 0.0–1.2)
CO2: 25 mmol/L (ref 20–28)
Calcium: 10.6 mg/dL — ABNORMAL HIGH (ref 8.6–10.2)
Chloride: 96 mmol/L (ref 96–108)
Creatinine: 1.75 mg/dL — ABNORMAL HIGH (ref 0.67–1.17)
GFR,Black: 44 * — AB
GFR,Caucasian: 38 * — AB
Glucose: 107 mg/dL — ABNORMAL HIGH (ref 60–99)
Lab: 41 mg/dL — ABNORMAL HIGH (ref 6–20)
Potassium: 5.7 mmol/L — ABNORMAL HIGH (ref 3.3–5.1)
Sodium: 133 mmol/L (ref 133–145)
Total Protein: 7 g/dL (ref 6.3–7.7)

## 2019-05-15 LAB — CBC AND DIFFERENTIAL
Baso # K/uL: 0 10*3/uL (ref 0.0–0.1)
Basophil %: 0.3 %
Eos # K/uL: 0.3 10*3/uL (ref 0.0–0.5)
Eosinophil %: 2 %
Hematocrit: 31 % — ABNORMAL LOW (ref 40–51)
Hemoglobin: 9.6 g/dL — ABNORMAL LOW (ref 13.7–17.5)
IMM Granulocytes #: 0 10*3/uL (ref 0.0–0.0)
IMM Granulocytes: 0.3 %
Lymph # K/uL: 1.4 10*3/uL (ref 1.3–3.6)
Lymphocyte %: 10.6 %
MCH: 29 pg/cell (ref 26–32)
MCHC: 31 g/dL — ABNORMAL LOW (ref 32–37)
MCV: 91 fL (ref 79–92)
Mono # K/uL: 0.9 10*3/uL — ABNORMAL HIGH (ref 0.3–0.8)
Monocyte %: 7.1 %
Neut # K/uL: 10.4 10*3/uL — ABNORMAL HIGH (ref 1.8–5.4)
Nucl RBC # K/uL: 0 10*3/uL (ref 0.0–0.0)
Nucl RBC %: 0 /100 WBC (ref 0.0–0.2)
Platelets: 322 10*3/uL (ref 150–330)
RBC: 3.4 MIL/uL — ABNORMAL LOW (ref 4.6–6.1)
RDW: 14.5 % — ABNORMAL HIGH (ref 11.6–14.4)
Seg Neut %: 79.7 %
WBC: 13 10*3/uL — ABNORMAL HIGH (ref 4.2–9.1)

## 2019-05-15 LAB — MAGNESIUM: Magnesium: 2.2 mg/dL (ref 1.6–2.5)

## 2019-05-15 LAB — APTT: aPTT: 27.4 s (ref 25.8–37.9)

## 2019-05-15 LAB — PROTIME-INR
INR: 1 (ref 0.9–1.1)
Protime: 12.1 s (ref 10.0–12.9)

## 2019-05-15 MED ORDER — OXYCODONE HCL 5 MG PO TABS *I*
10.0000 mg | ORAL_TABLET | ORAL | 0 refills | Status: DC | PRN
Start: 2019-05-15 — End: 2019-06-19
  Filled 2019-05-15: qty 120, 10d supply, fill #0

## 2019-05-15 MED ORDER — SENNOSIDES 8.6 MG PO TABS *I*
2.0000 | ORAL_TABLET | Freq: Every day | ORAL | 3 refills | Status: DC
Start: 2019-05-15 — End: 2019-07-02
  Filled 2019-05-15: qty 60, 30d supply, fill #0

## 2019-05-15 MED ORDER — MORPHINE SULFATE ER 30 MG PO TBCR *I*
30.0000 mg | ORAL_TABLET | Freq: Two times a day (BID) | ORAL | 0 refills | Status: DC
Start: 2019-05-15 — End: 2019-07-11
  Filled 2019-05-15: qty 60, 30d supply, fill #0

## 2019-05-15 MED ORDER — SODIUM POLYSTYRENE SULFONATE 15 GM/60ML PO SUSP *WRAPPED*
30.0000 g | Freq: Once | ORAL | 0 refills | Status: AC
Start: 2019-05-15 — End: 2019-05-15

## 2019-05-16 ENCOUNTER — Telehealth: Payer: Self-pay | Admitting: Hematology & Oncology

## 2019-05-16 MED ORDER — SODIUM POLYSTYRENE SULFONATE 15 GM/60ML PO SUSP *WRAPPED*
15.0000 g | Freq: Once | ORAL | 0 refills | Status: AC
Start: 2019-05-16 — End: 2019-05-16

## 2019-05-16 NOTE — Telephone Encounter (Signed)
Writer spoke with patient wife  -advised patient is to drink 77m of medication kayexalate once  -we would expect loose stools to get rid of extra K  -encouraged to call with questions or concerns  -pt wife verbalized understanding.

## 2019-05-16 NOTE — Telephone Encounter (Signed)
Writer spoke with patient's wife  -local pharmacy will not have kayexalate until 8/28.  -advised potassium is very high and would like pt to come in to pick up medication and for IVFs  -wife refused appt for fluids too far from pt  -will call [facility] to see if it is in stock  -confirmed in stock and pick up this afternoon and start medication  -encouraged to push fluids in interim.   -wife verbalized understanding

## 2019-05-17 NOTE — Patient Instructions (Signed)
Sent the following message to the email in the patients chart:    Hello,    Thank you for scheduling your telemedicine appointment with [place]. We use Zoom, a HIPAA compliant video/audio connection to provide a secure visit. We are excited to be able to offer this convenient way to connect with your provider.    Date: 06/05/2019   Time: 3:20 pm (please join the meeting 5 minutes before your scheduled appointment)  Provider: Dr. Girtha Rm Meeting Link:  [email or web address]   Zoom Meeting ID: [id number]  You will need to open the Zoom app and enter the Zoom Meeting ID to join the visit.  You can also copy and paste the [employer] Link into your [employer].    AT LEAST 1 DAY BEFORE YOUR APPOINTMENT:  Review the telemedicine consent form: Telehealth Consent (If you have any questions, or do not agree, please call the office to discuss your concerns. We will again obtain your verbal consent during the appointment)  Install Zoom on your smartphone or personal computer: Install and Test Zoom (Once the webpage loads, click Join. It will ask you to install Zoom if you do not have it already.  This is a test meeting to ensure you have Zoom downloaded.)    APPROXIMATELY 10 MINUTES BEFORE YOUR SCHEDULED APPOINTMENT:  Open the Zoom app  Select "Join A Meeting"  Enter the Meeting ID from above.    Please wait for your health care provider to arrive. You may receive a message that the host is in another meeting. Please stay online for at least 15 minutes into your appointment time. Your provider might be running late with another patient.    HELP IS HERE! If you are having any technical issues or need assistance setting up the Zoom application, please call [phone], available 8 a.m. to 4:30 p.m. weekdays or visit the [facility] website.    For MyChart questions, call our [facility], available 8 a.m. to 4:30 p.m. weekdays: [phone], [phone] (choose Option  1).    Hello,     Arpan Posey Pronto is inviting you to a scheduled Zoom meeting.     Topic: Dr. Girtha Rm Meeting with Burns Timson MRN [id number]  Time: Jun 05, 2019 03:20 PM [place] Time ([place] and [place])    [place] from [place], Wellsite geologist, Linux, Network engineer or Android: [email or web address]    Or iPhone one-tap ([place] Toll):  [phone],,[phone]#  or [phone],,[phone]#     Or Telephone:      Dial:      [phone] ([place] Toll)      [phone] ([place] Toll)      [phone] [phone] ([place] Toll)      [phone] ([place] Toll)      [phone] ([place] Toll)      [phone] [phone] ([place] Toll)      Meeting ID: [id number]      International numbers available: [email or web address]    Or an H.323/[employer] room system:      H.323:           [phone] ([place])          [phone] ([place])          [phone] ([place])          [phone] ([place])          [phone] ([place])  [phone] [place] Kitchen[place])          [phone] [place] Kitchen[place])          [phone] ([place])          [phone] [place] Kitchen[place]ol[place])          [phone] ([place])          [phone] ([place])      Meeting ID: [phone]        [place] equipped room: [id number]    Or Skype for Business [facility]):      [email or web address]        Thank you for allowing [place] to be partners in your care,  [facility]

## 2019-05-20 ENCOUNTER — Encounter: Payer: Self-pay | Admitting: Gastroenterology

## 2019-05-20 LAB — BASIC METABOLIC PANEL
Anion Gap: 10
CO2: 29 mmol/L
Calcium: 9.9 mg/dL
Chloride: 94 mmol/L
Creatinine: 1.7 mg/dL
GFR,Black: 48 *
GFR,Caucasian: 40 *
Glucose: 124 mg/dL
Lab: 35 mg/dL
Potassium: 4.8 mmol/L
Sodium: 133 mmol/L

## 2019-05-22 ENCOUNTER — Other Ambulatory Visit: Payer: Self-pay

## 2019-05-23 ENCOUNTER — Other Ambulatory Visit: Payer: Self-pay

## 2019-05-24 ENCOUNTER — Telehealth: Payer: Self-pay

## 2019-05-24 NOTE — Telephone Encounter (Signed)
Spoke with [name] (patient's partner) on 9/1 to offer bronch on 9/9 which she declined, requesting it to be scheduled to coordinate with other appointments on either 9/11 or 9/14.      Have not been able to get through on the main number ([phone]) yesterday 9/3 or today 9/4.  Left voice message on alternate number [phone]) advising we can schedule on 9/11, asked her to call back as soon as possible.  Will continue to try to reach patient/Richard Weaver on main number.    Please transfer call to Audrea Muscat when Richard Weaver/patient calls back.

## 2019-05-28 ENCOUNTER — Telehealth: Payer: Self-pay

## 2019-05-28 ENCOUNTER — Other Ambulatory Visit: Payer: Self-pay | Admitting: Pulmonology

## 2019-05-28 ENCOUNTER — Encounter: Payer: Self-pay | Admitting: Gastroenterology

## 2019-05-28 DIAGNOSIS — Z01812 Encounter for preprocedural laboratory examination: Secondary | ICD-10-CM

## 2019-05-28 NOTE — Telephone Encounter (Signed)
Richard Weaver is scheduled to be seen in the pulmonary department for a bronchoscopy on 9/11 .      COVID-19 Testing Completed:  To be done today in [place]     Anticoagulation:  Patient on anticoagulation medications: No  Type: n/a  Last dose: n/a      Lab results: 05/15/19  1228   Protime 12.1   INR 1.0             Lab results: 05/15/19  1228   aPTT 27.4          Patient on supplemental oxygen: No  Type: n/a  Flow rate: n/a      Diet: Verified patient will be NPO as of midnight the night before procedure - Yes, okay to take meds with a sip of water in the AM      Diabetic:  No      Transportation:  Patient has transportation arranged: Ys                               Family accompanying patient to procedure: Yes                              (If available: family name and relationship - SO [name] )            Reviewed: Dyke Maes, RN 05/28/2019 11:49 AM

## 2019-05-29 ENCOUNTER — Telehealth: Payer: Self-pay | Admitting: Hematology & Oncology

## 2019-05-29 ENCOUNTER — Other Ambulatory Visit: Payer: Self-pay | Admitting: Hematology & Oncology

## 2019-05-29 LAB — COVID-19 NAAT (PCR): COVID-19 NAAT (PCR): NEGATIVE

## 2019-05-29 LAB — COVID-19 PCR

## 2019-05-29 MED ORDER — LORAZEPAM 0.5 MG PO TABS *I*
0.5000 mg | ORAL_TABLET | Freq: Four times a day (QID) | ORAL | 0 refills | Status: AC | PRN
Start: 2019-05-29 — End: ?

## 2019-05-29 NOTE — Telephone Encounter (Signed)
Writer spoke with [name] wife  -advised Dr. Posey Pronto will rx ativan to help pt relax but not to wear he is out completely  -wife would prefer rx to tops  -wife verbalized understanding.

## 2019-05-31 ENCOUNTER — Other Ambulatory Visit: Payer: Self-pay | Admitting: Vascular Surgery

## 2019-05-31 ENCOUNTER — Ambulatory Visit
Admission: RE | Admit: 2019-05-31 | Discharge: 2019-05-31 | Disposition: A | Discharge status: Home / Self Care | Payer: Medicare (Managed Care) | Source: Ambulatory Visit | Attending: Pulmonology | Admitting: Pulmonology

## 2019-05-31 ENCOUNTER — Ambulatory Visit
Admission: RE | Admit: 2019-05-31 | Discharge: 2019-05-31 | Disposition: A | Discharge status: Home / Self Care | Payer: Medicare (Managed Care) | Source: Ambulatory Visit | Attending: Radiology | Admitting: Radiology

## 2019-05-31 ENCOUNTER — Ambulatory Visit: Payer: Medicare (Managed Care) | Admitting: Pulmonology

## 2019-05-31 VITALS — BP 129/83 | HR 91 | Temp 97.9°F | Resp 26 | Wt 105.8 lb

## 2019-05-31 DIAGNOSIS — D481 Neoplasm of uncertain behavior of connective and other soft tissue: Secondary | ICD-10-CM | POA: Insufficient documentation

## 2019-05-31 DIAGNOSIS — I714 Abdominal aortic aneurysm, without rupture, unspecified: Secondary | ICD-10-CM

## 2019-05-31 DIAGNOSIS — J432 Centrilobular emphysema: Secondary | ICD-10-CM | POA: Insufficient documentation

## 2019-05-31 DIAGNOSIS — I2510 Atherosclerotic heart disease of native coronary artery without angina pectoris: Secondary | ICD-10-CM | POA: Insufficient documentation

## 2019-05-31 DIAGNOSIS — C3412 Malignant neoplasm of upper lobe, left bronchus or lung: Secondary | ICD-10-CM

## 2019-05-31 DIAGNOSIS — R918 Other nonspecific abnormal finding of lung field: Secondary | ICD-10-CM

## 2019-05-31 DIAGNOSIS — R911 Solitary pulmonary nodule: Secondary | ICD-10-CM

## 2019-05-31 DIAGNOSIS — D381 Neoplasm of uncertain behavior of trachea, bronchus and lung: Secondary | ICD-10-CM | POA: Insufficient documentation

## 2019-05-31 DIAGNOSIS — N281 Cyst of kidney, acquired: Secondary | ICD-10-CM | POA: Insufficient documentation

## 2019-05-31 LAB — POCT GLUCOSE: Glucose POCT: 84 mg/dL (ref 60–99)

## 2019-05-31 MED ORDER — LIDOCAINE HCL 1 % IJ SOLN *I*
155.0000 mg | Freq: Once | INTRAMUSCULAR | Status: AC
Start: 2019-05-31 — End: 2019-05-31
  Administered 2019-05-31: 155 mg

## 2019-05-31 MED ORDER — BARIUM SULFATE (REDI-CAT) 2 % PO SUSP *I*
450.0000 mL | Freq: Once | ORAL | Status: AC
Start: 2019-05-31 — End: 2019-05-31
  Administered 2019-05-31: 350 mL via ORAL

## 2019-05-31 MED ORDER — MIDAZOLAM HCL 5 MG/5ML IJ SOLN *I*
INTRAMUSCULAR | Status: AC
Start: 2019-05-31 — End: 2019-05-31
  Filled 2019-05-31: qty 15

## 2019-05-31 MED ORDER — LIDOCAINE HCL (PF) 4 % IJ SOLN *I*
INTRAMUSCULAR | Status: AC
Start: 2019-05-31 — End: 2019-05-31
  Filled 2019-05-31: qty 5

## 2019-05-31 MED ORDER — FENTANYL CITRATE 50 MCG/ML IJ SOLN *WRAPPED*
INTRAMUSCULAR | Status: AC
Start: 2019-05-31 — End: 2019-05-31
  Filled 2019-05-31: qty 6

## 2019-05-31 MED ORDER — MIDAZOLAM HCL 5 MG/5ML IJ SOLN *I*
8.5000 mg | Freq: Once | INTRAMUSCULAR | Status: AC
Start: 2019-05-31 — End: 2019-05-31
  Administered 2019-05-31: 8.5 mg via INTRAVENOUS

## 2019-05-31 MED ORDER — FLUORODEOXYGLUCOSE F-18 FDG IV *I*
10.9200 | Freq: Once | INTRAVENOUS | Status: AC | PRN
Start: 2019-05-31 — End: 2019-05-31
  Administered 2019-05-31: 10.92 via INTRAVENOUS

## 2019-05-31 MED ORDER — FENTANYL CITRATE 50 MCG/ML IJ SOLN *WRAPPED*
212.5000 ug | Freq: Once | INTRAMUSCULAR | Status: AC
Start: 2019-05-31 — End: 2019-05-31
  Administered 2019-05-31: 212.5 ug via INTRAVENOUS

## 2019-05-31 MED ORDER — SODIUM CHLORIDE 0.9 % IV SOLN WRAPPED *I*
500.0000 mL | Freq: Once | Status: AC
Start: 2019-05-31 — End: 2019-05-31
  Administered 2019-05-31: 500 mL via INTRAVENOUS

## 2019-05-31 MED ORDER — LIDOCAINE HCL (PF) 4 % IJ SOLN *I*
40.0000 mg | Freq: Once | INTRAMUSCULAR | Status: AC
Start: 2019-05-31 — End: 2019-05-31
  Administered 2019-05-31: 40 mg via RESPIRATORY_TRACT

## 2019-05-31 NOTE — Procedures (Signed)
Assumed care of patient after bedside report. Patient is very agitated, combative, pulling off monitoring equipment and trying to get oob. Oriented to self only, not following commands. Dr. Nead at bedside and aware, no new orders at this time. Will continue to monitor.Faythe Dingwall, RN  Patient oob to chair with assist., still very agitated. Combative, perseverating on leaving. Sig other at bedside for support. POC/status updates discussed at bedside. Faythe Dingwall, RN  Discharge instructions discussed at bedside with [facility]. Dr. Nead at bedside and agreed with d/c home at this time.Faythe Dingwall, RN

## 2019-05-31 NOTE — H&P (View-Only) (Signed)
Pulmonary Procedure Attending    73yo with PET avid lung lesion here for bronchoscopic evaluation.  Also with AAA that vascular kindly evaluated today; deemed acceptable risk for today's procedure.    Past Medical History:   Diagnosis Date    AAA (abdominal aortic aneurysm)     CHF (congestive heart failure)     Chronic kidney disease     COPD (chronic obstructive pulmonary disease)     GERD (gastroesophageal reflux disease)     Hypertension        History reviewed. No pertinent surgical history.    Current Outpatient Medications   Medication Sig    LORazepam (ATIVAN) 0.5 MG tablet Take 1 tablet (0.5 mg total) by mouth every 6 hours as needed for Anxiety (scans) Max daily dose: 2 mg    aspirin 81 MG EC tablet Take 81 mg by mouth daily    atorvastatin (LIPITOR) 80 MG tablet Take 80 mg by mouth daily    carvedilol (COREG) 3.125 MG tablet Take 3.125 mg by mouth 2 times daily (with meals)    esomeprazole (NEXIUM) 40 MG capsule Take 40 mg by mouth daily (before breakfast)    lisinopril (PRINIVIL,ZESTRIL) 30 MG tablet Take 30 mg by mouth daily    budesonide-formoterol (SYMBICORT) 80-4.5 MCG/ACT inhaler Inhale 2 puffs into the lungs 2 times daily Shake well before each use.    Lidocaine 4 % patch Place 1 patch onto the skin every 24 hours Remove & discard patch within 12 hours or as directed.    morphine (MS CONTIN) 30 MG 12 hr tablet Take 1 tablet (30 mg total) by mouth 2 times daily Max daily dose: 60 mg    oxyCODONE (ROXICODONE) 5 MG immediate release tablet Take 2 tablets (10 mg total) by mouth every 4 hours as needed for Pain Max daily dose: 12 tablets    senna (SENOKOT) 8.6 MG tablet Take 2 tablets by mouth daily (Patient not taking: Reported on 05/31/2019)       Allergy History as of 05/31/19      No Known Allergies (drug, envir, food or latex)                Vitals:    05/31/19 1205   BP:    Pulse: 75   Resp: 16   Temp:    Weight:      No acute distress.  Mallampati Class III  Some rhonchi  bil  Regular rate  NABS.  Soft, non-tender.    Imaging reviewed.    Assessment:  Suspected lung cancer.    Plan:  Bronch today.

## 2019-05-31 NOTE — Procedures (Signed)
Patient arrived to [facility]. Alert and Oriented x3. Ambulatory. Procedure consents reviewed and signed with MD. See scanned consents. IV placed. See IV assessment/LDA flow-sheet for details of insertion and removal. Bilateral breath sounds present, see flow-sheet assessments for further information.     Lidocaine Max calculated pre-procedure based on patient's weight of 48 kg. Lidocaine max 4.'5mg'$ /kg= 216 mg MAX dose of Lidocaine permitted per hospital policy. Lidocaine will be administered in a 4% nebulized solution prior to the case, and 1% will be given in divided doses topically to mucous membranes of the respiratory tract via the biopsy channel of the bronchoscope.     Post procedure report given to Faythe Dingwall, RN for recovery of patient. See flow-sheets and MD procedure notes for further information.  Dyke Maes, RN  05/31/2019 5:41 PM

## 2019-05-31 NOTE — Interval H&P Note (Signed)
UPDATES TO PATIENT'S CONDITION on the DAY OF SURGERY/PROCEDURE    I. Updates to Patient's Condition (to be completed by a provider privileged to complete a H&P, following reassessment of the patient by the provider):    Day of Surgery/Procedure Update:  History  History reviewed and no change    Physical  Physical exam updated and no change    Pre-Procedure Assessment  Airway Visibility: [place]  Heart sounds rhythm: Regular Rate Rhythm  ASA Physical status rating: Class III: Severe systemic disease       II. Procedure Readiness   I have reviewed the patient's H&P and updated condition. By completing and signing this form, I attest that this patient is ready for surgery/procedure.    III. Attestation   I have reviewed the updated information regarding the patient's condition and it is appropriate to proceed with the planned surgery/procedure.    I have reminded Mr. Fraticelli that COVID-19 is still present in our community. He was advised that [facility] and its affiliates have made deliberate and widespread changes to policies and procedures, consistent with applicable directives, in order to reduce the risk of exposure in our facilities.     I further explained and Mr. Dreibelbis understands that given the communicability of the SARS CoV2 coronavirus, there remains a small but real risk of contracting the disease while receiving perioperative care - even with stringent preventive measures in place.   Mr. Maus understands the potential consequences of COVID-19 disease as it relates to their planned procedure and anticipated postoperative course.      The patient and I have considered and discussed the relative risks and benefits of proceeding with his/her surgery - both in terms of the procedure itself, and also in the context of the ongoing pandemic.  Mr. Engram wishes to proceed with the procedure.     Christapher Gillian, MD as of 12:13 PM 05/31/2019

## 2019-05-31 NOTE — Patient Instructions (Addendum)
Bronchoscopy Discharge Instructions    Instructions:   Do not drive, operative heavy machinery, drink alcoholic beverages, make important or business decisions, or sign legal documents until the next day.   Rest today   Take acetaminophen per package instructions for any mild sore throat or chest discomfort.    Diet:  Resume your previous diet.    Medications:  Please review the medication instructions included in the after visit summary.    Things to Expect:   A mild sore throat or chest discomfort.   If you were given medications for sleep or to relax you, you may still feel sleepy.  You may expect to sleep at home for an additional 4 to 6 hours.   Coughing after the procedure may last 2 to 3 days. You may have traces of blood in your mucus.    You Should Call Your Doctor for any of the Following:   Persistent fever of 101 degrees F (38.5 degrees C) or higher and/or chills.   Shortness of breath or trouble catching your breath.   Sharp pain under the armpit that does not go away.   Coughing up large amounts of blood (for example, over a teaspoon or increasing amounts).   Feeling restless.   Pain or redness at IV site.    If you have a serious problem after hours, call [phone] to reach the physician on call.    If you are unable to reach the provider, go to your nearest Emergency Department.    Follow up care:  Please call [phone] to discuss your procedure/results with the doctor in 7 day(s).  Call Dr. Felisa Bonier at [phone] to make a follow up appointment in 2 weeks.      I have read and understand the above instructions and have received a copy.    Patient Signature:______________________________________________    Nurse Signature:_______________________________________________

## 2019-05-31 NOTE — Provider Consult (Signed)
Vascular Surgery Consult H & P    Consult Reason:   Incidental finding of 7.9 cm infrarenal AAA     Requested By:   Dr Nead     HPI:   Richard Weaver is a 73 y.o. male here for bronchoscopy as a workup by Dr Nead. We were consulted to check on the patient in the outpatient setting for an incidental finding of AAA 7.9cm on PET scan. 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 Patient Denies abdominal pain, back pain, reports bilateral lower extremity numbness/ claudication after walking for 5 mins which is resolved by rest. Patient reports bilateral lower extremities feel the same. Currently complains of left upper back pain for which he is being worked up.         REVIEW OF SYSTEMS  ROS    MEDICAL HISTORY  Past Medical History:   Diagnosis Date    AAA (abdominal aortic aneurysm)     CHF (congestive heart failure)     Chronic kidney disease     COPD (chronic obstructive pulmonary disease)     GERD (gastroesophageal reflux disease)     Hypertension        SURGICAL HISTORY  History reviewed. No pertinent surgical history.    FAMILY HISTORY  Family History   Problem Relation Age of Onset    Cancer Mother         esophageal    Cancer Sister         lung    Heart attack Father         SOCIAL HISTORY   reports that he has been smoking cigarettes. He started smoking about 50 years ago. He has a 75.00 pack-year smoking history. He has never used smokeless tobacco. He reports previous alcohol use. He reports that he does not use drugs.     ALLERGIES  Patient has no known allergies (drug, envir, food or latex).     MEDICATIONS  Current Outpatient Medications   Medication Sig    LORazepam (ATIVAN) 0.5 MG tablet Take 1 tablet (0.5 mg total) by mouth every 6 hours as needed for Anxiety (scans) Max daily dose: 2 mg    aspirin 81 MG EC tablet Take 81 mg by mouth daily    atorvastatin (LIPITOR) 80 MG tablet Take 80 mg  by mouth daily    carvedilol (COREG) 3.125 MG tablet Take 3.125 mg by mouth 2 times daily (with meals)    esomeprazole (NEXIUM) 40 MG capsule Take 40 mg by mouth daily (before breakfast)    lisinopril (PRINIVIL,ZESTRIL) 30 MG tablet Take 30 mg by mouth daily    budesonide-formoterol (SYMBICORT) 80-4.5 MCG/ACT inhaler Inhale 2 puffs into the lungs 2 times daily Shake well before each use.    Lidocaine 4 % patch Place 1 patch onto the skin every 24 hours Remove & discard patch within 12 hours or as directed.    morphine (MS CONTIN) 30 MG 12 hr tablet Take 1 tablet (30 mg total) by mouth 2 times daily Max daily dose: 60 mg    oxyCODONE (ROXICODONE) 5 MG immediate release tablet Take 2 tablets (10 mg total) by mouth every 4 hours as needed for Pain Max daily dose: 12 tablets    senna (SENOKOT) 8.6 MG tablet Take 2 tablets by mouth daily (Patient not taking: Reported on 05/31/2019)     Current Facility-Administered Medications   Medication    sodium chloride 0.9 %  IV 500 mL          Objective:      '@VITALSRANGE'$ @    Intake/Output  '@IOTHISSHIFT'$ @    CMP  '@LABRCNTIP'$ (NA:3,K:3,CL:3,co2:3,bun:3,creat:3,labglom:3,glucose,calcium:3,ALT:3,AST:3,Aphos:3,TB:3,DB:3,amy:3,lip:3, lac:3)@    Coags  '@LABRCNTIP'$ (aptt:3,inr:3,ptt:3)@    CBC  '@LABRCNTIP'$ (wbc:3,hgb:3,hct:3,PLT:3)@    Imaging:      Physical Exam:      General appearance: alert, cooperative and pale  HEENT: extra ocular movement intact  CV: S1, S2 normal  Lungs: clear  Abd: soft, non-tender. Palpable pulsatile aneurysm.   Extremities:  R femoral pulse 2+  R popliteal 2+  Pedals not palpable, doppler not done in outpatient bronchoscopy clinic   Cap refill <2sec   Motor/sensory intact       L femoral pulse non palpable, doppler not done in outpatient bronchoscopy clinic   Cap refill <2sec   Motor/sensory intact     Neuro: mental status, speech normal, alert and oriented x3    Assessment:   Richard Weaver is a 73 y.o. male worked up for lung Ca with incidental finding of 7.9  cm AAA with Left CIA occlusion. We were consulted to assess acuity of the pathology.       Plan:           -Patient has no abdominal/aortic  Tenderness and isnt considered as acute aortic syndrome   -can proceed with sedation for bronchoscopy with no concern for acute aortic syndrome   -follow up with Dr  Frances Furbish as outpatient in 2 weeks, discussed with patient and his wife   -US aorta complete with ABI s   -discussed with Dr Frances Furbish       Please page the vascular surgery resident on call for any questions.  Brently Voorhis Rachad Mw Sunday Spillers, MD  Vascular Surgery  05/31/2019 at 11:47 AM

## 2019-05-31 NOTE — Procedures (Signed)
Bronchoscopy Procedure Note    DATE OF PROCEDURE: 05/31/2019  TIME OF PROCEDURE:  12:20 - 14:01 scope time  ROOM: AC-3 Bronchoscopy Suite  ROUTE: Oral     FACULTY: Legrand Como Briceyda Abdullah, MD, PhD    ASSISTANT: None    PRIMARY INDICATION: other: LUL lung mass    Current Smoker: Current, pack years: 4  HIV: no  Immunocompromised, non-HIV: no    Radiographic Findings: LUL lung mass, PET avid, with local bone erosion  Pre-procedure diagnosis: abnormal PET scan, cancer suspected  Post-procedure diagnosis: apparent malignancy    PRE-PROCEDURE TIME-OUT:  Please see nursing sedation navigator.    Working IV access was assured before administration of sedatives.  The vascular surgery fellow kindly assessed him prior to the procedure to be certain it was safe given his significant abdominal aortic aneurysm.  The oropharynx was anesthetized with lidocaine.  The scope was advanced to the epiglottis and vocal cords and these structures were specifically anesthetized before advancing the scope through the cords.  The EBUS scope was deployed first.  Lymph nodes beginning on the right and transitioning to the left side were identified and biopsied as noted below.  Onsite cytology did not see malignancy from any of the lymph nodes sampled.  As result, we then transitioned to the interventional scope.  Airway inspection did not yield any endobronchial abnormalities except for some ectatic airways in the left upper lobe apical posterior subsegment.  The mucosa was irregular.  We performed brush biopsies into the left upper lobe apical posterior subsegment with onsite cytology identifying positive findings.  Additional brushings were then performed for cellblock with washing being collected at the same time.  Endobronchial biopsies were then performed of the irregular mucosa and pooled with the brush sample and sent to cytology.  Minimal oozing occurred with the brushing and the biopsies; this was self-limited.  He tolerated the procedure  well.    PROCEDURE:  ENDOBRONCHIAL BIOPSY:   Location: Left Upper Lobe  Biopsies: 5  Specimens sent DUK:GURKYHCW/CBJS counts   Preliminary Onsite Cytology Results: n/a    EBUS GUIDED NEEDLE ASPIRATION:   Lymph node station(s) as follows:  4L >  # of passes: 4  size: 6x3m  Specimen sent for:  Cytopathology  Preliminary Cytology Findings:  CELN  4R >  # of passes: 3  size: 12x829m Specimen sent for:  Cytopathology  Preliminary Cytology Findings:  heme, bronchial cells  7 >  # of passes: 4  size: 6x7m94mSpecimen sent for:  Cytopathology  Preliminary Cytology Findings:  CELN  10L >  # of passes: 4  size: 9x17  Specimen sent for:  Cytopathology  11R >  # of passes: 3  size: 5mm55mpecimen sent for:  Cytopathology  Preliminary Cytology Findings:  heme, bronchial cells    BRONCHIAL WASH:   Location: Left Upper Lobe  Specimens sent for: Cytology/Cell counts    BRONCHIAL BRUSH:   Location: Left Upper Lobe  Passes: 4  Specimens sent for: Cytology/Cell counts  Preliminary Onsite Cytology Results: malignant cells present    Moderate Sedation:  Faculty Member above performed and monitored unless otherwise specified.  The time for the face-to-face monitoring of the moderate sedation by the attending as  documented under the sedation navigator.    Moderate Sedation Face Times  Start Time: 1217  End Time: 1405  Duration (minutes): 108 Minutes       MEDICATIONS FOR PROCEDURE:  40 mg nebulized 4% lidocaine  155 mg topical  1% lidocaine  8.5 mg Versed  212.5 mcg Fentanyl    The observed limit of 300 mg of lidocaine was not exceeded.      COMPLICATIONS:  None      EBL   Minimal        Signed: Mieshia Pepitone, MD, 05/31/2019

## 2019-05-31 NOTE — Preop H&P (Signed)
Pulmonary Procedure Attending    73yo with PET avid lung lesion here for bronchoscopic evaluation.  Also with AAA that vascular kindly evaluated today; deemed acceptable risk for today's procedure.    Past Medical History:   Diagnosis Date    AAA (abdominal aortic aneurysm)     CHF (congestive heart failure)     Chronic kidney disease     COPD (chronic obstructive pulmonary disease)     GERD (gastroesophageal reflux disease)     Hypertension        History reviewed. No pertinent surgical history.    Current Outpatient Medications   Medication Sig    LORazepam (ATIVAN) 0.5 MG tablet Take 1 tablet (0.5 mg total) by mouth every 6 hours as needed for Anxiety (scans) Max daily dose: 2 mg    aspirin 81 MG EC tablet Take 81 mg by mouth daily    atorvastatin (LIPITOR) 80 MG tablet Take 80 mg by mouth daily    carvedilol (COREG) 3.125 MG tablet Take 3.125 mg by mouth 2 times daily (with meals)    esomeprazole (NEXIUM) 40 MG capsule Take 40 mg by mouth daily (before breakfast)    lisinopril (PRINIVIL,ZESTRIL) 30 MG tablet Take 30 mg by mouth daily    budesonide-formoterol (SYMBICORT) 80-4.5 MCG/ACT inhaler Inhale 2 puffs into the lungs 2 times daily Shake well before each use.    Lidocaine 4 % patch Place 1 patch onto the skin every 24 hours Remove & discard patch within 12 hours or as directed.    morphine (MS CONTIN) 30 MG 12 hr tablet Take 1 tablet (30 mg total) by mouth 2 times daily Max daily dose: 60 mg    oxyCODONE (ROXICODONE) 5 MG immediate release tablet Take 2 tablets (10 mg total) by mouth every 4 hours as needed for Pain Max daily dose: 12 tablets    senna (SENOKOT) 8.6 MG tablet Take 2 tablets by mouth daily (Patient not taking: Reported on 05/31/2019)       Allergy History as of 05/31/19      No Known Allergies (drug, envir, food or latex)                Vitals:    05/31/19 1205   BP:    Pulse: 75   Resp: 16   Temp:    Weight:      No acute distress.  Mallampati Class III  Some rhonchi  bil  Regular rate  NABS.  Soft, non-tender.    Imaging reviewed.    Assessment:  Suspected lung cancer.    Plan:  Bronch today.

## 2019-06-03 ENCOUNTER — Ambulatory Visit
Admission: RE | Admit: 2019-06-03 | Discharge: 2019-06-03 | Disposition: A | Discharge status: Home / Self Care | Payer: Medicare (Managed Care) | Source: Ambulatory Visit | Attending: Hematology & Oncology | Admitting: Hematology & Oncology

## 2019-06-03 ENCOUNTER — Other Ambulatory Visit: Payer: Self-pay | Admitting: Hematology & Oncology

## 2019-06-03 DIAGNOSIS — Z85118 Personal history of other malignant neoplasm of bronchus and lung: Secondary | ICD-10-CM | POA: Insufficient documentation

## 2019-06-03 DIAGNOSIS — I6782 Cerebral ischemia: Secondary | ICD-10-CM | POA: Insufficient documentation

## 2019-06-03 DIAGNOSIS — R918 Other nonspecific abnormal finding of lung field: Secondary | ICD-10-CM

## 2019-06-03 MED ORDER — GADOBUTROL 1 MMOL/ML (GADAVIST) IV SOLN *WRAPPED*
5.0000 mL | Freq: Once | INTRAVENOUS | Status: AC
Start: 2019-06-03 — End: 2019-06-03
  Administered 2019-06-03: 5 mL via INTRAVENOUS

## 2019-06-05 ENCOUNTER — Ambulatory Visit: Payer: Medicare (Managed Care) | Attending: Hematology & Oncology | Admitting: Hematology & Oncology

## 2019-06-05 DIAGNOSIS — C3490 Malignant neoplasm of unspecified part of unspecified bronchus or lung: Secondary | ICD-10-CM | POA: Insufficient documentation

## 2019-06-05 DIAGNOSIS — Z720 Tobacco use: Secondary | ICD-10-CM | POA: Insufficient documentation

## 2019-06-05 DIAGNOSIS — I714 Abdominal aortic aneurysm, without rupture, unspecified: Secondary | ICD-10-CM

## 2019-06-05 DIAGNOSIS — N179 Acute kidney failure, unspecified: Secondary | ICD-10-CM

## 2019-06-05 DIAGNOSIS — I10 Essential (primary) hypertension: Secondary | ICD-10-CM

## 2019-06-05 DIAGNOSIS — R918 Other nonspecific abnormal finding of lung field: Secondary | ICD-10-CM | POA: Insufficient documentation

## 2019-06-05 DIAGNOSIS — J449 Chronic obstructive pulmonary disease, unspecified: Secondary | ICD-10-CM | POA: Insufficient documentation

## 2019-06-05 DIAGNOSIS — K219 Gastro-esophageal reflux disease without esophagitis: Secondary | ICD-10-CM | POA: Insufficient documentation

## 2019-06-06 ENCOUNTER — Other Ambulatory Visit: Payer: Self-pay | Admitting: Hematology & Oncology

## 2019-06-06 ENCOUNTER — Encounter: Payer: Self-pay | Admitting: Hematology & Oncology

## 2019-06-06 DIAGNOSIS — C3490 Malignant neoplasm of unspecified part of unspecified bronchus or lung: Secondary | ICD-10-CM

## 2019-06-06 MED ORDER — SODIUM POLYSTYRENE SULFONATE 15 GM/60ML PO SUSP *WRAPPED*
30.0000 g | Freq: Once | ORAL | 0 refills | Status: AC
Start: 2019-06-06 — End: 2019-06-06

## 2019-06-06 NOTE — Addendum Note (Signed)
Addended by: Jacquelyne Balint on: 06/06/2019 12:53 PM     Modules accepted: Orders

## 2019-06-06 NOTE — Progress Notes (Addendum)
[facility] - Outpatient Follow Up Note    Name: Richard Weaver     MRN: [id number]  Date of Birth: [birth year]    Referring Provider/PCP: Dr. Sherolyn Buba  PCP: Margurite Auerbach, MD  Rad/Onc: None  CardioThoracic Surgery: None  Pulmonary: None    Diagnosis/Stage: Likely at least stage IIIA lung cancer, no biopsy done yet  ECOG Performance Status: (3) Capable of limited self-care, confined to bed or chair > 50% of waking hours    Oncologic History  12/2018 - left shoulder blade pain started, declined ED evaluation.   02/16/19 - ED visit for left-sided chest pain. CTA Chest showed irregular posterior pleural base LUL mass (5.4 x 3.3 cm) with post left 4th rib involvement, extension to left superior hilum and left hilar adenopathy.   03/11/19 - Evaluated by [facility] (Dr. Julien Nordmann). Was recommended to have biopsy of LUL mass, with plans for referral to Radiation Oncology. Note stated "highly suspicious stage IIIA (T3, N1, M0)."  03/2019 - ED [facility]) visit for self-limited hemoptysis. Was treated with course of Augmentin. Declined hospital admission.   05/15/19- NPV here at [facility]    CC: Left chest and left upper back pain    Initial History of Present Illness:  Richard Weaver is a 73 y.o. male with a PMHx of HTN, HLD, COPD, CAD, HFrEF, GERD, CKD3, Anemia, hx of polio, LBP, anxiety, insomnia, migraines, PVD, left iliac aa thrombosis, large AAA (8x7.2cm, declined surgery), cigarette smoking (50 PYHx), hx basal cell cancer, hx SqCC left hand who presents for evaluation of recently diagnosed left upper lung mass with bony involvement.  He initially came with his partner, Richard Weaver.   The patient was having left shoulder blade pain around April 2020 but declined to go to ED at that time. Patient then presented to ED 02/16/19 for left-sided chest pain. CTA Chest done showed irregular posterior pleural base LUL mass (5.4 x 3.3 cm) with post left 4th rib involvement, with extension to left  superior hilum and left hilar adenopathy. Imaging highly suspicious for lung cancer, concerning for at least stage IIIA. Evaluated by [facility] in [place]. on 03/11/19 (Dr. Julien Nordmann), and he was recommended to have biopsy of LUL mass, plans for referral to Radiation Oncology.  He had ED visit for hemoptysis 03/2019, which self resolved.  He was discharged with a course of antibiotics at that time though he is unsure why. Patient then decided to move back to [place] state and establish care here.  They report a perceived delay in their care as they had initially been trying to the [facility] but there were issues with paperwork.   Patient states that he has not had any biopsy done.  He reports a 20 to 30 pound weight loss over the past 3 months with physical deconditioning.  Reports that his legs will go numb when he tries to stand and walk for any prolonged period of time, which limits his activity.  He believes this is due to his vascular issues and had previously been following with vascular surgery.  He has a large known AAA, which he says he will deal with after his lung cancer treatment is established.  He notes a poor appetite though eats a good variety of foods including high-protein.  His #1 issue at this point is left-sided chest pain and left upper back pain, for which he has been taking morphine 30 mg ER twice daily.  He  was previously on short acting opiates but is no longer taking these.  He has dealt with opiate-induced constipation, for which she says milk of magnesia works well.  Reports dry mouth, which often wakes him up at night despite using a humidifier.  He notes a history of polio when he was much younger, though up until last year he was very functional and is able to be completely independent.  He remained seated for much of the day currently, and is able to meet his ADLs but struggles to do so.  He notes that his sister had lung cancer with chemotherapy, and after seeing  her struggling with this he would not ever want chemotherapy.  He says he would consider radiation therapy if needed.  He reports some skin irritation on his buttocks from sitting for prolonged periods of time, though denies any skin breakdown or open wounds.  Denies significant shortness of breath, diarrhea, palpitations, leg swelling.  Does have an occasional cough that is productive of white to yellow sputum.  He has had no hematemesis since last month.   Socially, patient is a former Administrator. Orginially from [place], recently moved back to [place] state from [place]. Likes to work on cars but has not been able to do this much lately.  Reports extensive cigarette smoking history, smoking 1 to 2 packs/day since his mid 67s, though recently down to 4 to 5/day.  Denies alcohol or illicit drug use history.  Has been living with his significant other, Richard Weaver.    Interm/Subjective:    The patient states he has cough and chest pain post biopsy. Has ongoing fatigue. Cough is yellow. Denies any fevers, chills. Denies any neurological complaints. Main issue is fatigue, weight loss.     The pain is in the back is same area, no radiation, burning pain, slightly improved with opoids, good improvement with topical lidocaine.      Review of Systems:  Review of Systems   Constitutional: Positive for malaise/fatigue and weight loss.   HENT: Negative.    Eyes: Negative.    Respiratory: Positive for cough, sputum production and shortness of breath.    Cardiovascular: Positive for chest pain.   Gastrointestinal: Negative.    Genitourinary: Negative.    Musculoskeletal: Positive for back pain and joint pain.   Skin: Negative.    Neurological: Negative.    Endo/Heme/Allergies: Negative.    Psychiatric/Behavioral: Negative.    All other systems reviewed and are negative.    Past Medical History  Past Medical History:   Diagnosis Date    AAA (abdominal aortic aneurysm)     CHF (congestive heart failure)     Chronic kidney  disease     COPD (chronic obstructive pulmonary disease)     GERD (gastroesophageal reflux disease)     Hypertension      Social History  Social History     Socioeconomic History    Marital status: Divorced     Spouse name: Not on file    Number of children: Not on file    Years of education: Not on file    Highest education level: Not on file   Occupational History    Not on file   Tobacco Use    Smoking status: Current Every Day Smoker     Packs/day: 1.50     Years: 50.00     Pack years: 75.00     Types: Cigarettes     Start date: 09/19/1968  Smokeless tobacco: Never Used    Tobacco comment: Currently down to 4-5 per day.    Substance and Sexual Activity    Alcohol use: Not Currently     Frequency: Never    Drug use: Never    Sexual activity: Not on file   Social History Narrative    Not on file     Past Surgical History  History reviewed. No pertinent surgical history.    Family History  Cancer-related family history includes Cancer in his mother and sister.    Medications  Current Outpatient Medications on File Prior to Visit   Medication Sig Dispense Refill    LORazepam (ATIVAN) 0.5 MG tablet Take 1 tablet (0.5 mg total) by mouth every 6 hours as needed for Anxiety (scans) Max daily dose: 2 mg 6 tablet 0    aspirin 81 MG EC tablet Take 81 mg by mouth daily      atorvastatin (LIPITOR) 80 MG tablet Take 80 mg by mouth daily      carvedilol (COREG) 3.125 MG tablet Take 3.125 mg by mouth 2 times daily (with meals)      esomeprazole (NEXIUM) 40 MG capsule Take 40 mg by mouth daily (before breakfast)      lisinopril (PRINIVIL,ZESTRIL) 30 MG tablet Take 30 mg by mouth daily      budesonide-formoterol (SYMBICORT) 80-4.5 MCG/ACT inhaler Inhale 2 puffs into the lungs 2 times daily Shake well before each use.      Lidocaine 4 % patch Place 1 patch onto the skin every 24 hours Remove & discard patch within 12 hours or as directed.      morphine (MS CONTIN) 30 MG 12 hr tablet Take 1 tablet (30 mg  total) by mouth 2 times daily Max daily dose: 60 mg 60 tablet 0    oxyCODONE (ROXICODONE) 5 MG immediate release tablet Take 2 tablets (10 mg total) by mouth every 4 hours as needed for Pain Max daily dose: 12 tablets 120 tablet 0    senna (SENOKOT) 8.6 MG tablet Take 2 tablets by mouth daily (Patient not taking: Reported on 05/31/2019) 60 tablet 3     No current facility-administered medications on file prior to visit.      Allergy:  Allergy History as of 05/15/19      No Known Allergies (drug, envir, food or latex)                Physical Exam   Constitutional: He is oriented to person, place, and time. He appears well-developed and well-nourished.   HENT:   Head: Normocephalic.   Eyes: Conjunctivae and EOM are normal.   Neck: Normal range of motion.   Pulmonary/Chest: Effort normal. No respiratory distress.   Abdominal: There is no guarding.   Neurological: He is alert and oriented to person, place, and time.   Psychiatric: He has a normal mood and affect. His behavior is normal. Judgment and thought content normal.     Laboratory:   None in our system  From scanned records (03/2019): Na 134, K 4.8, SCr 1.7, WBC 13.3, Hgb 11.2, PLT 318, LFT wnl     Radiology: imaging from scanned studies  CTA Chest 530/20:      MRI Brain 03/04/19: no intracranial metastasis    PET 03/05/19:      Pathology  Medical Cytology   Medical Cytology   Collected:  05/31/19 1400    Result status:  Final    Resulting lab:  [place]  Value:  [id number]   Additional Copy to:   Lovie Chol, MD     Final Diagnosis:   (A) Lymph node, 11R, endobronchial ultrasound-guided fine needle   aspiration:      - Insufficient material for cytologic evaluation.     (B) Lymph node, 4R, endobronchial ultrasound-guided fine needle   aspiration:      -Negative. No malignant cells identified.      - Cellular evidence of lymph node.     (C) Lymph node, station 7, endobronchial ultrasound-guided fine needle   aspiration:      - Negative. No  malignant cells identified.      - Cellular evidence of lymph node.     (D) Lymph node, 4L, endobronchial ultrasound-guided fine needle   aspiration:      - Negative. No malignant cells identified.      - Cellular evidence of lymph node.     (E) Lymph node, 10L, endobronchial ultrasound-guided fine needle   aspiration:      - Negative. No malignant cells identified.      - Cellular evidence of lymph node.     (F) Lung, left upper lobe, endobronchial ultrasound-guided fine needle   aspiration:      - Non-small cell carcinoma, favor squamous cell carcinoma. See   comment.     (G) Lung, left upper lobe, bronchial wash:   - Atypical cells present.        Impression  Chloe Baig is a 73 y.o. male with a PMHx of HTN, HLD, COPD, CAD, HFrEF, GERD, CKD3, Anemia, hx of polio, LBP, anxiety, insomnia, migraines, PVD, left iliac aa thrombosis, large AAA (8x7.2cm, declined surgery), cigarette smoking (50 PYHx), hx basal cell cancer, hx SqCC left hand who presents for evaluation of recently diagnosed left upper lung mass with bony involvement.  Patient will require biopsy and further imaging to determine his treatment options.    Recommendation    1. Left upper lung mass, likely lung cancer at least stage IIIA, T3N1  - appreciate pulmonary support, pending histology   - MRI Head negative  - will present in tumor board to verify stage  - can consider concurrent chemo/RT->Durva, clinical trial, sequential Chemo->RT->Durva  - will refer to rad/onc for question of pall RT and hopeful plan for sequential  - will refer to pall care for cancer induced pain    ADDENDUM:   Presented in tumor board, suggested MRI of the spine, will refer to pall care, if remains T3N1 will prescribe chemotherapy (cisplatin hopefully based) followed by radiation.     2. Cancer induced pain  - morphine ER '30mg'$  BID. Started oxycodone '5mg'$  Q4H PRN.    3. Heme Status  - normocytic anemia/inflammation     4. ID status  - No  signs/symptoms of infection.    5. FENR  - mild hyperkalemia, will repeat blood work and provide kayaxylate if K remains elevated  - protein cal malnutiriton - consider nutritional services     6. Tobacco abuse  - Provided counseling, education, and moral support on smoking cessation. Active smoking during treatment could negatively impact outcome.  Patient has cut back to 4 to 5 cigarettes/day.    7. AAA (8x7.2cm), PVD  - Had recently declined surgical intervention.   - appreciate vascular input pre bronch    8. HTN  - Patient currently on ACEI    9. HFrEF/CAD/HLD  - ASA, statin  -We will likely need to hold aspirin for upcoming biopsy.  10. COPD  - Only taking Sybicort PRN, <1x/week  - No current supplemental oxygen use    11. GERD  - takes esomeprazole      Lovie Chol, MD  Internal Medicine, PGY-3

## 2019-06-06 NOTE — Addendum Note (Signed)
Addended by: Lovie Chol on: 06/06/2019 02:01 PM     Modules accepted: Orders

## 2019-06-07 ENCOUNTER — Telehealth: Payer: Self-pay

## 2019-06-10 ENCOUNTER — Telehealth: Payer: Self-pay | Admitting: Hematology & Oncology

## 2019-06-10 NOTE — Telephone Encounter (Signed)
Writer spoke with patient's SO  -apologized for taking a bit to call her back  -per Dr. Posey Pronto, good with moving forward with chemotherapy.    -scheduled 1st cycle 9/30 at 1130 NP followed by chemo after.  Please get blood work by 11AM in [place] that day  -SO asked about RT, advised recommended to start chemo with RT after.    -SO unsure pt is fully on board for chemo  -advised can see about Dr. Posey Pronto speaking with patient again to review treatment regimen recommended.   -SO appreciative of assistance  -SO worried about cancer growing and causing more symptoms.

## 2019-06-11 ENCOUNTER — Telehealth: Payer: Self-pay | Admitting: Hematology & Oncology

## 2019-06-11 ENCOUNTER — Other Ambulatory Visit: Payer: Self-pay | Admitting: Hematology & Oncology

## 2019-06-11 DIAGNOSIS — C3491 Malignant neoplasm of unspecified part of right bronchus or lung: Secondary | ICD-10-CM

## 2019-06-11 MED ORDER — PROCHLORPERAZINE MALEATE 10 MG PO TABS *I*
10.0000 mg | ORAL_TABLET | Freq: Four times a day (QID) | ORAL | 5 refills | Status: DC | PRN
Start: 2019-06-11 — End: 2019-07-11

## 2019-06-11 MED ORDER — ONDANSETRON HCL 8 MG PO TABS *I*
8.0000 mg | ORAL_TABLET | Freq: Three times a day (TID) | ORAL | 5 refills | Status: AC | PRN
Start: 2019-06-11 — End: ?

## 2019-06-11 NOTE — Plan of Care (Signed)
DISCONTINUE ON PATHWAY REGIMEN - Non-Small Cell Lung    No Medical Intervention - Off Treatment.    REASON: Other Reason  PRIOR TREATMENT: Off Treatment    START OFF PATHWAY REGIMEN - Non-Small Cell Lung            Paclitaxel (Taxol)       Carboplatin (Paraplatin)     **Always confirm dose/schedule in your pharmacy ordering system**    Patient Characteristics:  Stage III - Unresectable, PS >= 2  AJCC T Category: T3  Current Disease Status: No Distant Mets or Local Recurrence  AJCC N Category: N1  AJCC M Category: M0  AJCC 8 Stage Grouping: IIIA  ECOG Performance Status: 2  Intent of Therapy:  Curative Intent, Discussed with Patient

## 2019-06-11 NOTE — Plan of Care (Signed)
DISCONTINUE OFF PATHWAY REGIMEN - Non-Small Cell Lung            Paclitaxel (Taxol)       Carboplatin (Paraplatin)     **Always confirm dose/schedule in your pharmacy ordering system**    REASON: Other Reason  PRIOR TREATMENT: Off Pathway: Carboplatin + Paclitaxel (6/225) q21d  TREATMENT RESPONSE: Unable to Evaluate    Non-Small Cell Lung - No Medical Intervention - Off Treatment.    Patient Characteristics:  AJCC T Category: T3  Current Disease Status: No Distant Mets or Local Recurrence  AJCC N Category: N1  AJCC M Category: M0  AJCC 8 Stage Grouping: IIIA

## 2019-06-11 NOTE — Telephone Encounter (Signed)
Discussed 20+mins with wife/patient re: chemo/plans/schedule/AEs. All questions answered.

## 2019-06-11 NOTE — Plan of Care (Signed)
START OFF PATHWAY REGIMEN - Non-Small Cell Lung            Paclitaxel (Taxol)       Carboplatin (Paraplatin)     **Always confirm dose/schedule in your pharmacy ordering system**    Patient Characteristics:  Stage III - Unresectable, PS >= 2  AJCC T Category: T3  Current Disease Status: No Distant Mets or Local Recurrence  AJCC N Category: N1  AJCC M Category: M0  AJCC 8 Stage Grouping: IIIA  ECOG Performance Status: 2  Intent of Therapy:  Curative Intent, Discussed with Patient

## 2019-06-12 NOTE — Progress Notes (Addendum)
Radiation Oncology Outpatient Consult Note 06/13/2019      Patient ID: [name] is a 73 y.o. 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 We were asked by Dr. Posey Pronto to evaluate the patient and discuss with him the potential treatment options involving radiation therapy as they relate to his diagnosis.     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   02/16/19 - ED visit for left-sided chest pain. CTA Chest showed irregular posterior pleural base LUL mass (5.4 x 3.3 cm) with post left 4th rib involvement, extension to left superior hilum and left hilar adenopathy.   03/11/19 - Evaluated by [place] (Dr. Julien Nordmann in [facility] ). Was recommended to have biopsy of LUL mass, with plans for referral to Radiation Oncology. Note stated "highly suspicious stage IIIA (T3, N1, M0)."  03/2019 - ED [facility]idd[facility]) visit for self-limited hemoptysis. Was treated with course of Augmentin. Declined hospital admission.   05/15/19- NPV(new patient visit) here at [facility]  06/03/19 MR head: No evidence of intracranial metastasis. Moderate chronic microvascular ischemic change. Diffuse parenchymal volume loss.   06/19/2019: Scheduled to start with Chemotherapy with Dr.Patel     Interval History:    His most significant symptom is pain in the left side of the chest side wrapping around his chest. He says currently it localized to that region and is persistent. It is controlled with MsContin and Oxycodone, he is still having constipation with it. Most recently he did have a bowel movement yesterday however. He is following with Dr.Patel regarding this concern and opoid management.      He continues to have shortness of breath but this is managed with the use of the symbicort <1/week, he experiences shortness of breath, and wheezing at times. There is an associated cough  productive of white sputum.     These symptoms have been stable for him, most recently he is experiencing weight loss of 20lbs now weighing around 105 lbs. He is a vasculopath with AAA and he is meeting with Vascular surgery June 28 2019, Dr. Joaquim Lai. He is still continuing to smoke he down to 4 -5 cigarettes previously 1-2 pack/day since in his 1s.      He is still currently scheduled to start with chemotherapy with  Carboplatin,Taxol with Dr. Posey Pronto on 06/19/2019.     Review of Systems   Constitutional: Negative for chills, fever and weight loss.   HENT: Negative for congestion, ear discharge, ear pain, hearing loss, nosebleeds, sinus pain and tinnitus.    Respiratory: Positive for cough (whiite sputum), hemoptysis, shortness of breath and wheezing. Negative for stridor.    Cardiovascular: Positive for chest pain, orthopnea and claudication. Negative for palpitations, leg swelling and PND.   Gastrointestinal: Positive for constipation. Negative for abdominal pain, blood in stool, diarrhea, heartburn, melena, nausea and vomiting.   Genitourinary: Negative for dysuria, flank pain, frequency, hematuria and urgency.   Skin: Negative for itching and rash.   Neurological: Negative for dizziness, tingling, tremors, sensory change, speech change, focal weakness, seizures, loss of consciousness, weakness and headaches.       Radiation Questions:  Implantable cardiac device? No   Autoimmune disease? No   Collagen vascular disease? No   T2DM? No   Prior radiation therapy? No   Ability to comfortably lay flat? Yes   Good  range of motion in the arms? Yes   Claustrophobia? No   Able to follow commands? Yes   Possibility of pregnancy? n/a  Additional considerations for treatment/planning? Pain has pain in the right shoulder with significant pain, but is able to lie flat for 15-20 mins.      Current Outpatient Medications   Medication Sig    ondansetron (ZOFRAN) 8 MG tablet Take 1 tablet (8 mg total) by mouth 3 times daily as  needed Begin 72 hours after treatment if needed for nausea.    prochlorperazine (COMPAZINE) 10 MG tablet Take 1 tablet (10 mg total) by mouth every 6 hours as needed (nausea)    LORazepam (ATIVAN) 0.5 MG tablet Take 1 tablet (0.5 mg total) by mouth every 6 hours as needed for Anxiety (scans) Max daily dose: 2 mg    aspirin 81 MG EC tablet Take 81 mg by mouth daily    atorvastatin (LIPITOR) 80 MG tablet Take 80 mg by mouth daily    carvedilol (COREG) 3.125 MG tablet Take 3.125 mg by mouth 2 times daily (with meals)    esomeprazole (NEXIUM) 40 MG capsule Take 40 mg by mouth daily (before breakfast)    lisinopril (PRINIVIL,ZESTRIL) 30 MG tablet Take 30 mg by mouth daily    budesonide-formoterol (SYMBICORT) 80-4.5 MCG/ACT inhaler Inhale 2 puffs into the lungs 2 times daily Shake well before each use.    Lidocaine 4 % patch Place 1 patch onto the skin every 24 hours Remove & discard patch within 12 hours or as directed.    morphine (MS CONTIN) 30 MG 12 hr tablet Take 1 tablet (30 mg total) by mouth 2 times daily Max daily dose: 60 mg    oxyCODONE (ROXICODONE) 5 MG immediate release tablet Take 2 tablets (10 mg total) by mouth every 4 hours as needed for Pain Max daily dose: 12 tablets    senna (SENOKOT) 8.6 MG tablet Take 2 tablets by mouth daily (Patient not taking: Reported on 05/31/2019)       Past Medical History:   Diagnosis Date    AAA (abdominal aortic aneurysm)     CHF (congestive heart failure)     Chronic kidney disease     COPD (chronic obstructive pulmonary disease)     GERD (gastroesophageal reflux disease)     Hypertension        No past surgical history on file.    Social History     Socioeconomic History    Marital status: Divorced     Spouse name: Not on file    Number of children: Not on file    Years of education: Not on file    Highest education level: Not on file   Tobacco Use    Smoking status: Current Every Day Smoker     Packs/day: 1.50     Years: 50.00     Pack years: 75.00      Types: Cigarettes     Start date: 09/19/1968    Smokeless tobacco: Never Used    Tobacco comment: Currently down to 4-5 per day.    Substance and Sexual Activity    Alcohol use: Not Currently     Frequency: Never    Drug use: Never    Sexual activity: Not on file   Other Topics Concern    Not on file   Social History Narrative    Not on file       Cancer-related family history includes Cancer in his mother  and sister.    Review of Systems:  A 12-point review of symptoms was completed and was otherwise negative except as noted in the HPI.    Physical Exam:    Complete physical exam deferred due to telemedicine visit in setting of COVID.     Performance Status (KPS): Score-80- Normal activity with effort, some signs or symptoms of disease.    Imaging:  Mr Head Without & With Contrast    Result Date: 06/04/2019  No evidence of intracranial metastasis. Moderate chronic microvascular ischemic changes. Diffuse parenchymal volume loss. END OF IMPRESSION [facility] submits this DICOM format image data and final report to the [facility], an independent secure electronic health information exchange, on a reciprocally searchable basis (with patient authorization) for a minimum of 12 months after exam date.    Chest Single Frontal View    Result Date: 05/31/2019  Apical left lung lesion possibly tumor or infection. No pneumothorax after biopsy END OF IMPRESSION [facility] submits this DICOM format image data and final report to the [facility], an independent secure electronic health information exchange, on a reciprocally searchable basis (with patient authorization) for a minimum of 12 months after exam date.    Pet/ct (fdg) General Metabolic Tumor Imaging"-"    Result Date: 05/31/2019  1. Hypermetabolic cavitary left upper lobe mass is highly concerning for locally aggressive pulmonary malignancy, with direct erosion of the T3 and T4 vertebrae and extension into the central spinal canal at this level. Recommend  correlation with results  from upcoming bronchoscopy, and consider contrast-enhanced thoracic spine MRI to further evaluate the extent of this lesion. 2. Low-grade FDG avidity of a nonenlarged left lower hilar lymph node. This is nonspecific but remains suspicious for a nodal metastasis in the setting of the above. No additional hypermetabolic lymphadenopathy. 3. No hypermetabolic distant metastases. 4. Large irregular multilobular renal to infrarenal abdominal aortic aneurysm (at least 8 x 6 cm). There is a left lateral projection from this aneurysm that conforms to the immediately adjacent vertebral body, extending somewhat posteriorly. On the comparative contrast-enhanced CT this aneurysm, including the left lateral projection, is extensively thrombosed. This aortic morphology, however, has been described in the literature as having been associated with impending aortic aneurysm rupture and/or contained chronic rupture. Recommend vascular surgery consultation. 5. Gross secretions within the trachea, suggestive of aspiration. Results were reported to Dr. Lovie Chol by Dr. Jacquelyne Balint of radiology via telephone on 05/31/2019 9:36 AM. END IMPRESSION I have personally reviewed the images and the Resident's/Fellow's interpretation and agree with or edited the findings. [facility] submits this DICOM format image data and final report to the [facility], an independent secure electronic health information exchange, on a reciprocally searchable basis (with patient authorization) for a minimum of 12 months after exam date.    PET Scan:        Pathology:  Collected:  05/31/19 1400    Result status:  Final    Resulting lab:  [facility]    Value:  [id number]   Additional Copy to:   Lovie Chol, MD     Final Diagnosis:   (A) Lymph node, 11R, endobronchial ultrasound-guided fine needle   aspiration:      - Insufficient material for cytologic evaluation.     (B) Lymph node, 4R, endobronchial ultrasound-guided fine  needle   aspiration:      -Negative. No malignant cells identified.      - Cellular evidence of lymph node.     (  C) Lymph node, station 7, endobronchial ultrasound-guided fine needle   aspiration:      - Negative. No malignant cells identified.      - Cellular evidence of lymph node.     (D) Lymph node, 4L, endobronchial ultrasound-guided fine needle   aspiration:      - Negative. No malignant cells identified.      - Cellular evidence of lymph node.     (E) Lymph node, 10L, endobronchial ultrasound-guided fine needle   aspiration:      - Negative. No malignant cells identified.      - Cellular evidence of lymph node.     (F) Lung, left upper lobe, endobronchial ultrasound-guided fine needle   aspiration:      - Non-small cell carcinoma, favor squamous cell carcinoma. See   comment.     (G) Lung, left upper lobe, bronchial wash:   - Atypical cells present.        Laboratory Data:  Reviewed. No significant abnormalities noted.     Assessment:  Richard Weaver is a 73 y.o. male with history of Stage IIIc (cT4,cN1,cM0) with CT scan demonstrating a mass in the LUL that measures 43.56m x 63.844m with concomitant PET avidity with SUV max 14 which was eroding the T3, T4  vertebrae and posterior ribs 3-5, this is causing him significant pain symptoms. We discussed different radiation options from most aggressive of 6 week of 60 Gy x 30 fractions, 30 Gy x 2 week on- 2 weeks off - 30 Gy x 2 weeks on,  or palliative radiation to the bone 8 Gy x 1 to the bone and ribs specifically. For now they elected to go ahead with chemotherapy and we will [facility] and begin radiation subsequently. If there are new onset of symptoms he is aware to contact usKoreand we can begin radiation sooner. We discussed the overall process of radiation, adverse effects including but not limited to radiation dermatitis, injury to adjacent structures such as esophageal discomfort,  possible spinal cord injury, nausea, and fatigue. All his questions were answered to satisfaction the patient will check in with usKoreance he is completed with chemotherapy. Which radiation modality will likely change depending how the tumor changes with chemotherapy, as well as how his functional status will change with chemotherapy, and any vascular surgery interventions to increase perfusion to the bilateral lower extremities.     Plan:  - Plan for radiation once patient completed chemotherapy, further discuss radiation treatment regimen basement of changes to the LUL mass and his functional status   - RoSailor Haughnas informed to contact usKoreaith any other requests in the interim.     The patient was also seen and evaluated by Dr. MiNorm Parcelplease see attending notes for addendum, future plans and formal attestation to follow.     Ku GuJearld Pies.D.   Radiation Oncology PGY-2   [facility] Phone: [phone] Fax: [phone]    I did personally evaluate the patient by phone. I personally reviewed the case and agree with the resident's/fellow's findings and plan of care as documented above.    Location of Attending Physician: hospital / clinical location      21+ minutes were spent on the phone with the patient, patient representative, and/or other attendees.          MIPolo RileyMD,PHD

## 2019-06-13 ENCOUNTER — Ambulatory Visit: Payer: Medicare (Managed Care) | Admitting: Radiation Oncology

## 2019-06-13 DIAGNOSIS — C3491 Malignant neoplasm of unspecified part of right bronchus or lung: Secondary | ICD-10-CM

## 2019-06-13 NOTE — Patient Instructions (Addendum)
Your team:  Lovie Chol, MD, Windell Moulding, NP and your nurse is Othella Boyer, RN.      Please call the [place] Triage Phone with any questions or concerns, [phone].  This number is open 24 hrs, 7 days a week.  Your call will be routed to your nurse between 8am-5pm, and an on-call physician will return your call evenings, nights, weekends and holidays.      If you need to cancel or change an appointment, please call [phone]    Please allow five days for team to complete any disability paperwork.     SOCIAL WORK: Erin Sons [phone]    NUTRITION: Mikle Bosworth [phone]    Monday [address] [place] [address] [facility]  Wednesday Clinic - Ground Floor [address] [facility]  Thursday Clinic- Second Floor [address]        September 2020      "Sunday Monday Tuesday Wednesday Thursday Friday Saturday             1     2     3     4     5       6     7     8     9     10     11    PET/CT FDG GEN METABOLIC TUMOR   7:15 AM   (30 min.)   RIS, RR PET1 (RRPET1)   [facility] [address]    BRONCH1  10:30 AM   (30 min.)   Nead, Michael, MD   [facility]    XR CHEST SINGLE VIEW   2:40 PM   (5 min.)   RIS, [facility] DR P31 (P31)   [facility] 12       13     14    MR HEAD WITH CONTRAST   5:00 PM   (60 min.)   RIS, [facility] PET/MR2 (CHPET)   [facility] 15     16    TELEHOME   3:20 PM   (20 min.)   Patel, Arpan, MD   [facility] 17     18     19       20     21     22     23     24    TELEHOME   2:00 PM   (60 min.)   Milano, Michael, MD,PHD   [facility] 25     26       27     28     29     30"$   Blood Work    NP 11:30      Stop lisinopril  & coreg                                October 2020      'Sunday Monday Tuesday Wednesday Thursday Friday Saturday'$                        '1     2      3       '$ 4  5     6  CV ABDOMINAL US  9:00 [facility] [employer]  9:30 [facility] at [facility]    Dr. Frances Furbish 10:00 [facility] Dept of [place] of Vascular Surgery     7    Blood test     NP '@__'$     Cycle 1/ taxol  Carbo  '@11'$ :30AM '8     9     10       11     12     13     14  '$ VIDEO VISIT  Palliative Care Dr. Simona Huh 1:00  '15     16     17       18     19     20   21     22     23    '$ MR THORACIC SPINE  4:00 [facility] [place] '24       25     26     27  '$ Blood Work    NP __    Carboplatin / Taxol Cycle 2 10AM     '28     29      30     31                 '$ November 2020      'Sunday Monday Tuesday Wednesday Thursday Friday Saturday   1     2     3     4     5     6     7       8     9     10     11     12     13     14       15     16     17     18     19     20     21       22     23     24     25     26    HOLIDAY 27    HOLIDAY 28       29     18 September 2019      Sunday Monday Tuesday Wednesday Thursday Friday Saturday             1     2     3     4     5       6     7     8     9     10     11     12       13     14     15     16     17     18     19       20     21     22     23'$   24     25  HOLIDAY   '26       27     28     29     30     31                           '$ January 2021      'Sunday Monday Tuesday Wednesday Thursday Friday Saturday                            1  HOLIDAY   2       3     4     5     6     7     8     9       10     11     12     13     14     15     16       17     18     19     20     21     22     23       24     25     26     27     28     29     30       31                                               February 2021      Sunday Monday Tuesday Wednesday Thursday Friday Saturday        1     2     3     4     5     6       7     8     9     10     11     12     13       14     15     16     17     18     19     20       21     22     23     24     25     26      27       28'$                                              Have weekly blood work done prior to your doctor appointment every visit     You will receive anti-emetics (anti-nausea medications) to help with side effects.     Ondansetron (Zofran): '8mg'$  tablets. Take 1 tablet every 8 hours as needed for nausea.     Prochlorperazine (Compazine) : '10mg'$  tablet. Take '10mg'$  every 6 hour as necessary for nausea. This can be taken with ondansetron if necessary.     Possible side effects  of chemotherapy:    Hair Loss or hair thinning   Lack of appetite, taste change    Nausea/vomiting    Mouth sores    Numbness/tingling    Low blood counts    Low Red blood cells - can cause anemia    Low White blood cells (neutropenia)  - can cause infection    Low Platelets - risk for increased bleeding    Constipation/diarrhea    Fatigue    Pain    Difficulty concentrating    Reproductive/sexuality changes   Others (see drug specific handout)     How to take care of yourself:   Practice good hand washing and personal hygiene   Avoid people that are ill   Exercise, yet take rest periods as needed   Use a soft toothbrush   Avoid activities that may cause injury or bleeding   Increase non-caffeinated fluid intake to 2-3 quarts per day   Eat a well-balanced diet, which may include a multiple vitamin   Apply lip balm to keep lips moist   Rinse your mouth with salt water solution after all meals and before bedtime   Use condoms for sexual activity for 48 hours after chemotherapy   Do NOT take any over the counter medications, herbal supplements, or have dental procedures without first discussing with your oncology team      Managing Constipation      1. Keep a record of your bowel movements    2. Increase oral fluids (minimum of 2 Liters/Quarts per day)    3. Exercise, such as walking or other aerobic exercise.    4. Eat a high Fiber diet (20-35 grams of fiber per day)   Raw vegetables, fresh or dried fruits, whole grains, nuts, powdered  fiber supplements, prune juice    5.    Bowel Medication Regimen:    Begin taking bowel medications:  with pain medication, with anti-nausea medicine, and when you begin chemo.   Start with Colace (docusate) - stool softener - 1 tab a day   Senokot (senna) - natural laxative - 1 tab a day    If you do not have a bowel movement every day or every other day, take one tab of Colace and one tab of Senokot each morning and each night.    You can take up to two Colace and two Senokot each morning and each night.  If you still do not have regular bowel movements, please call the [facility] or tell your doctor.     Decrease the  dose of each if you develop frequent/loose stools   Adjust so that you have a soft BM each day    6. For intermittent worsening of constipation take:  Miralax (polyethylene glycol) 1 heaping teaspoon in 8 ounces of water/juice per day (follow directions on the bottle)      7.     Call the office if you do not have BM in 2 days.       Many people cope well while receiving chemotherapy treatments. But, most people have some anxiety, concerns, worries, or nervousness. Please discuss anything that is concerning with your oncology team.       When to call the MD:   Call your oncology team with questions about your care or any non-emergency medical problem including:   A temperature over 100.4 F (38 C) (It is advised to purchase a digital thermometer)  Pain, redness, or swelling in the area where you  received your shot or IV   Nausea, vomiting, or no appetite for several days   Sores or white spots in your mouth   Constipation or diarrhea for more than one day   Bruising or have small red spots under your skin   Any unusual bleeding   Feeling very sad for several days   Feeling like your heart is beating very fast   Frequent and/or painful urination   A cough that is new, or that doesnt go away   Unusual swelling, pain or warmth in your arm or leg   Feel light headed or faint   New or  uncontrolled pain     Seek care immediately (call 911 or have someone bring you to the Emergency Department) for any medical emergency, including:   Chest pain, shortness of breath, or trouble breathing   A severe headache that does not go away, feeling confused, or have new trouble seeing   Bleeding that does not stop or slow down after several minutes     [facility] phone # [phone].     During business hours your call will be returned by a nurse. After business hours and on the weekend your call will be answered by an on-call physician.

## 2019-06-16 ENCOUNTER — Encounter: Payer: Self-pay | Admitting: Primary Care

## 2019-06-18 NOTE — Progress Notes (Signed)
[facility] - [facility] - Outpatient Follow Up Note    Name: Richard Weaver     MRN: [id number]  Date of Birth: 1946/07/25    Referring Provider/PCP: Dr. Sherolyn Weaver  PCP: Richard Auerbach, MD  Rad/Onc: None  CardioThoracic Surgery: None  Pulmonary: None    Diagnosis/Stage: Likely at least stage IIIA lung cancer  ECOG Performance Status: 2-3    Oncologic History  12/2018 - left shoulder blade pain started, declined ED evaluation.   02/16/19 - ED visit for left-sided chest pain. CTA Chest showed irregular posterior pleural base LUL mass (5.4 x 3.3 cm) with post left 4th rib involvement, extension to left superior hilum and left hilar adenopathy.   03/11/19 - Evaluated by [facility] (Dr. Julien Weaver). Was recommended to have biopsy of LUL mass, with plans for referral to Radiation Oncology. Note stated "highly suspicious stage IIIA (T3, N1, M0)."  03/2019 - ED [facility]) visit for self-limited hemoptysis. Was treated with course of Augmentin. Declined hospital admission.   05/15/19- NPV here at [facility]    CC: left back/chest, shoulder blade pain       History of Present Illness:    Interm/Subjective:    Comes back to clinic prior to startng carbo/taxol accompanied by  significant other, Richard Weaver.    The patient complains of ongoing left upper chest pain/upper back pain post biopsy. Has ongoing fatigue and very tired today. Took MScontin about an hour ago which makes him sleepy. Ran out of oxycodone 2 days ago, had been taking BID and was helpful. Continues lidocaine patch. Continues coreg and lisinopril, some lightheadedness with position changes. Poor appetite, forcing himself to eat and drink fluids.  Denies any fevers, chills. No nausea/vomiting. Has not moved his bowels in 3 days. Passing gas. No abd pain. Cough improved. Stable DOE.     Review of Systems:  Review of Systems   Constitutional: Positive for malaise/fatigue and weight loss.   HENT: Negative.    Eyes: Negative.    Respiratory:  Positive for cough, sputum production and shortness of breath.    Cardiovascular: Positive for chest pain.   Gastrointestinal: Positive of constipation  Genitourinary: Negative.    Musculoskeletal: Positive for back pain and joint pain.   Skin: Negative.    Neurological: Negative.    Endo/Heme/Allergies: Negative.    Psychiatric/Behavioral: Negative.    All other systems reviewed and are negative.    Past Medical History  Past Medical History:   Diagnosis Date    AAA (abdominal aortic aneurysm)     CHF (congestive heart failure)     Chronic kidney disease     COPD (chronic obstructive pulmonary disease)     GERD (gastroesophageal reflux disease)     Hypertension      Social History  Social History     Socioeconomic History    Marital status: Divorced     Spouse name: Not on file    Number of children: Not on file    Years of education: Not on file    Highest education level: Not on file   Occupational History    Not on file   Tobacco Use    Smoking status: Current Every Day Smoker     Packs/day: 1.50     Years: 50.00     Pack years: 75.00     Types: Cigarettes     Start date: 09/19/1968    Smokeless tobacco: Never Used    Tobacco comment: Currently down to  4-5 per day.    Substance and Sexual Activity    Alcohol use: Not Currently     Frequency: Never    Drug use: Never    Sexual activity: Not on file   Social History Narrative    Not on file     Past Surgical History  No past surgical history on file.    Family History  Cancer-related family history includes Cancer in his mother and sister.    Medications  Current Outpatient Medications on File Prior to Visit   Medication Sig Dispense Refill    ondansetron (ZOFRAN) 8 MG tablet Take 1 tablet (8 mg total) by mouth 3 times daily as needed Begin 72 hours after treatment if needed for nausea. 20 tablet 5    prochlorperazine (COMPAZINE) 10 MG tablet Take 1 tablet (10 mg total) by mouth every 6 hours as needed (nausea) 30 tablet 5    LORazepam (ATIVAN)  0.5 MG tablet Take 1 tablet (0.5 mg total) by mouth every 6 hours as needed for Anxiety (scans) Max daily dose: 2 mg 6 tablet 0    aspirin 81 MG EC tablet Take 81 mg by mouth daily      atorvastatin (LIPITOR) 80 MG tablet Take 80 mg by mouth daily      carvedilol (COREG) 3.125 MG tablet Take 3.125 mg by mouth 2 times daily (with meals)      esomeprazole (NEXIUM) 40 MG capsule Take 40 mg by mouth daily (before breakfast)      lisinopril (PRINIVIL,ZESTRIL) 30 MG tablet Take 30 mg by mouth daily      budesonide-formoterol (SYMBICORT) 80-4.5 MCG/ACT inhaler Inhale 2 puffs into the lungs 2 times daily Shake well before each use.      Lidocaine 4 % patch Place 1 patch onto the skin every 24 hours Remove & discard patch within 12 hours or as directed.      morphine (MS CONTIN) 30 MG 12 hr tablet Take 1 tablet (30 mg total) by mouth 2 times daily Max daily dose: 60 mg 60 tablet 0    oxyCODONE (ROXICODONE) 5 MG immediate release tablet Take 2 tablets (10 mg total) by mouth every 4 hours as needed for Pain Max daily dose: 12 tablets 120 tablet 0    senna (SENOKOT) 8.6 MG tablet Take 2 tablets by mouth daily (Patient not taking: Reported on 05/31/2019) 60 tablet 3     No current facility-administered medications on file prior to visit.      Allergy:  Allergy History as of 05/15/19      No Known Allergies (drug, envir, food or latex)                Physical Exam    BP (!) 88/59    Pulse 90    Temp 36.7 C (98.1 F)    Resp 17    Ht 162 cm (5' 3.78")    Wt 47.4 kg (104 lb 8 oz)    SpO2 93%    BMI 18.06 kg/m   Wt Readings from Last 3 Encounters:   06/19/19 47.4 kg (104 lb 8 oz)   05/31/19 48 kg (105 lb 13.1 oz)   05/31/19 48 kg (105 lb 13.1 oz)        Constitutional: He is oriented to person, place, and time. He appears tired and closing his eyes occasionally during the visit.  HENT:   Head: Normocephalic.   Eyes: Conjunctivae and EOM are normal.   Neck: Normal  range of motion.   Pulmonary/Chest: Effort normal. No  respiratory distress.   Abdominal: There is no guarding.   Neurological: He is alert and oriented to person, place, and time.   Psychiatric: He has a normal mood and affect. His behavior is normal. Judgment and thought content normal.     Laboratory:       Lab results: 06/19/19  1111   WBC 14.4*   Hemoglobin 9.8*   Hematocrit 31*   RBC 3.5*   Platelets 305   Seg Neut % 84.0   Lymphocyte % 7.1   Monocyte % 5.1   Eosinophil % 3.0   Basophil % 0.3         Lab results: 06/19/19  1111   Sodium 137   Potassium 4.9   Chloride 97   CO2 30*   UN 32*   Creatinine 1.56*   GFR,Caucasian 44*   GFR,Black 50*   Glucose 114*   Calcium 9.9   Total Protein 6.2*   Albumin 3.1*   ALT 9   AST 12   Alk Phos 86   Bilirubin,Total 0.2          Radiology: imaging from scanned studies  CTA Chest 530/20:      MRI Brain 03/04/19: no intracranial metastasis    PET 03/05/19:      Pathology  Medical Cytology   Medical Cytology   Collected:  05/31/19 1400    Result status:  Final    Resulting lab:  [facility]    Value:  [id number]   Additional Copy to:   Lovie Chol, MD     Final Diagnosis:   (A) Lymph node, 11R, endobronchial ultrasound-guided fine needle   aspiration:      - Insufficient material for cytologic evaluation.     (B) Lymph node, 4R, endobronchial ultrasound-guided fine needle   aspiration:      -Negative. No malignant cells identified.      - Cellular evidence of lymph node.     (C) Lymph node, station 7, endobronchial ultrasound-guided fine needle   aspiration:      - Negative. No malignant cells identified.      - Cellular evidence of lymph node.     (D) Lymph node, 4L, endobronchial ultrasound-guided fine needle   aspiration:      - Negative. No malignant cells identified.      - Cellular evidence of lymph node.     (E) Lymph node, 10L, endobronchial ultrasound-guided fine needle   aspiration:      - Negative. No malignant cells identified.      - Cellular evidence of lymph node.     (F) Lung,  left upper lobe, endobronchial ultrasound-guided fine needle   aspiration:      - Non-small cell carcinoma, favor squamous cell carcinoma. See   comment.     (G) Lung, left upper lobe, bronchial wash:   - Atypical cells present.        Impression  Tayvion Lauder is a 73 y.o. male with a PMHx of HTN, HLD, COPD, CAD, HFrEF, GERD, CKD3, Anemia, hx of polio, LBP, anxiety, insomnia, migraines, PVD, left iliac aa thrombosis, large AAA (8x7.2cm, declined surgery), cigarette smoking (50 PYHx), hx basal cell cancer, hx SqCC left hand who presents for evaluation of recently diagnosed left upper lung mass with bony involvement.  Patient will require biopsy and further imaging to determine his treatment options.    Recommendation    1. Left upper lung  mass, likely lung cancer at least stage IIIA, T3N1  - MRI Head negative  - sequential chemoRT  - consented for carbo/taxol today but will not start chemo due to fatigue, hypotension and poor PS. IVF only today . Reschedule chemo in 1 week  - will see if pall care appt can be moved up - currently scheduled 10/14  - HCP completed today and will scan into chart       2. Cancer induced pain  - morphine ER 72m BID. Started oxycodone [id number] PRN- was taking BID ran out 2 days ago, will send in new r    3. Heme Status  - normocytic anemia/leukopenia/inflammation   - no fevers/chills    4. ID status  - No signs/symptoms of infection.    5. FENR/hypotension   - creatinine 1.57 (1.75)  - Push fluids at home and IVF today  - protein cal malnutiriton - consider nutritional services   - albumin 3.1  - eating hints book provided   - BP today 88/59- instructed to hold coreg and lisinopril and will cc PCP to this noted    6. Tobacco abuse  - Provided counseling, education, and moral support on smoking cessation. Active smoking during treatment could negatively impact outcome.  Patient has cut back to 4 to 5 cigarettes/day.    7. AAA (8x7.2cm), PVD  - Had recently declined surgical  intervention.   - appreciate vascular input pre bronch    8. Constipation  No bm in 3 day  Will take MOM when he gets home  Reviewed colace/senna  He does not like senna as caused abd cramps in the past     9. HFrEF/CAD/HLD  - ASA, statin    10. COPD  - Only taking Sybicort PRN, <1x/week  - No current supplemental oxygen use    11. GERD  - takes esomeprazole    12. Follow-up  RTC in 1 week with repeat labs prior to starting C1 carbo/taxol if feeling better      JuDietrich PatesFNP

## 2019-06-19 ENCOUNTER — Other Ambulatory Visit
Admission: RE | Admit: 2019-06-19 | Discharge: 2019-06-19 | Disposition: A | Discharge status: Home / Self Care | Payer: Medicare (Managed Care) | Source: Ambulatory Visit

## 2019-06-19 ENCOUNTER — Ambulatory Visit: Payer: Medicare (Managed Care)

## 2019-06-19 ENCOUNTER — Ambulatory Visit: Payer: Medicare (Managed Care) | Attending: Oncology | Admitting: Oncology

## 2019-06-19 VITALS — BP 88/59 | HR 90 | Temp 98.1°F | Resp 17 | Ht 63.78 in | Wt 104.5 lb

## 2019-06-19 DIAGNOSIS — Z9189 Other specified personal risk factors, not elsewhere classified: Secondary | ICD-10-CM

## 2019-06-19 DIAGNOSIS — C3491 Malignant neoplasm of unspecified part of right bronchus or lung: Secondary | ICD-10-CM | POA: Insufficient documentation

## 2019-06-19 DIAGNOSIS — R5383 Other fatigue: Secondary | ICD-10-CM

## 2019-06-19 DIAGNOSIS — R52 Pain, unspecified: Secondary | ICD-10-CM

## 2019-06-19 DIAGNOSIS — I10 Essential (primary) hypertension: Secondary | ICD-10-CM

## 2019-06-19 DIAGNOSIS — E860 Dehydration: Secondary | ICD-10-CM | POA: Insufficient documentation

## 2019-06-19 DIAGNOSIS — I959 Hypotension, unspecified: Secondary | ICD-10-CM

## 2019-06-19 LAB — COMPREHENSIVE METABOLIC PANEL
ALT: 9 U/L (ref 0–50)
AST: 12 U/L (ref 0–50)
Albumin: 3.1 g/dL — ABNORMAL LOW (ref 3.5–5.2)
Alk Phos: 86 U/L (ref 40–130)
Anion Gap: 10 (ref 7–16)
Bilirubin,Total: 0.2 mg/dL (ref 0.0–1.2)
CO2: 30 mmol/L — ABNORMAL HIGH (ref 20–28)
Calcium: 9.9 mg/dL (ref 8.6–10.2)
Chloride: 97 mmol/L (ref 96–108)
Creatinine: 1.56 mg/dL — ABNORMAL HIGH (ref 0.67–1.17)
GFR,Black: 50 * — AB
GFR,Caucasian: 44 * — AB
Glucose: 114 mg/dL — ABNORMAL HIGH (ref 60–99)
Lab: 32 mg/dL — ABNORMAL HIGH (ref 6–20)
Potassium: 4.9 mmol/L (ref 3.3–5.1)
Sodium: 137 mmol/L (ref 133–145)
Total Protein: 6.2 g/dL — ABNORMAL LOW (ref 6.3–7.7)

## 2019-06-19 LAB — CBC AND DIFFERENTIAL
Baso # K/uL: 0 10*3/uL (ref 0.0–0.1)
Basophil %: 0.3 %
Eos # K/uL: 0.4 10*3/uL (ref 0.0–0.5)
Eosinophil %: 3 %
Hematocrit: 31 % — ABNORMAL LOW (ref 40–51)
Hemoglobin: 9.8 g/dL — ABNORMAL LOW (ref 13.7–17.5)
IMM Granulocytes #: 0.1 10*3/uL — ABNORMAL HIGH (ref 0.0–0.0)
IMM Granulocytes: 0.5 %
Lymph # K/uL: 1 10*3/uL — ABNORMAL LOW (ref 1.3–3.6)
Lymphocyte %: 7.1 %
MCH: 28 pg/cell (ref 26–32)
MCHC: 31 g/dL — ABNORMAL LOW (ref 32–37)
MCV: 88 fL (ref 79–92)
Mono # K/uL: 0.7 10*3/uL (ref 0.3–0.8)
Monocyte %: 5.1 %
Neut # K/uL: 12.1 10*3/uL — ABNORMAL HIGH (ref 1.8–5.4)
Nucl RBC # K/uL: 0 10*3/uL (ref 0.0–0.0)
Nucl RBC %: 0 /100 WBC (ref 0.0–0.2)
Platelets: 305 10*3/uL (ref 150–330)
RBC: 3.5 MIL/uL — ABNORMAL LOW (ref 4.6–6.1)
RDW: 15.3 % — ABNORMAL HIGH (ref 11.6–14.4)
Seg Neut %: 84 %
WBC: 14.4 10*3/uL — ABNORMAL HIGH (ref 4.2–9.1)

## 2019-06-19 LAB — MEDICAL CYTOLOGY

## 2019-06-19 LAB — LACTATE DEHYDROGENASE: LD: 112 U/L — ABNORMAL LOW (ref 118–225)

## 2019-06-19 LAB — NEUTROPHIL #-INSTRUMENT: Neutrophil #-Instrument: 12.1 10*3/uL

## 2019-06-19 MED ORDER — SODIUM CHLORIDE 0.9 % IV BOLUS *I*
1000.0000 mL | Freq: Once | Status: AC
Start: 2019-06-19 — End: 2019-06-19
  Administered 2019-06-19: 1050 mL via INTRAVENOUS

## 2019-06-19 MED ORDER — OXYCODONE HCL 5 MG PO TABS *I*
10.0000 mg | ORAL_TABLET | ORAL | 0 refills | Status: DC | PRN
Start: 2019-06-19 — End: 2019-07-11

## 2019-06-19 NOTE — Progress Notes (Signed)
Patient here for IVF. PIV placed with positive blood return pre and post infusion. Patient tolerated infusion well. PIV removed. Patient discharged home in stable condition.

## 2019-06-19 NOTE — Progress Notes (Signed)
[place] TREATMENT HAND-OFF TOOL:   SITUATION:   Scheduled treatment category for today:     Cancer treatment:  IV Fluids    Is this a new cancer treatment?: Yes      Patient seen by APP    Consent obtained:  Not needed    Labs complete:  Yes    Current patient status:  New Treatment Plan    Comments on change:  IVF only 1 liter over 2 hours , cancelling carbo/taxol

## 2019-06-20 ENCOUNTER — Ambulatory Visit: Payer: Medicare (Managed Care) | Admitting: Hematology & Oncology

## 2019-06-20 ENCOUNTER — Telehealth: Payer: Self-pay | Admitting: Primary Care

## 2019-06-20 NOTE — Telephone Encounter (Signed)
Richard Weaver requesting to speak with Richard Weaver to reschedule an appointment. She can be reached at [phone] (home).

## 2019-06-24 ENCOUNTER — Ambulatory Visit: Payer: Medicare (Managed Care) | Admitting: Nephrology

## 2019-06-24 DIAGNOSIS — K5900 Constipation, unspecified: Secondary | ICD-10-CM

## 2019-06-24 DIAGNOSIS — R079 Chest pain, unspecified: Secondary | ICD-10-CM

## 2019-06-24 MED ORDER — MORPHINE SULFATE ER 15 MG PO TBCR *I*
15.0000 mg | ORAL_TABLET | Freq: Two times a day (BID) | ORAL | 0 refills | Status: DC
Start: 2019-06-24 — End: 2019-06-26

## 2019-06-24 NOTE — Progress Notes (Signed)
Palliative Care Consult    Consult Requested by: Dr. Posey Pronto  Patient encounter was performed via telemedicine.      '[x]'  Real time audiovisual    '[]'   [employer]      Patient located at  home    Provider located at:   '[]'  Home office  '[x]'  Clinical office   '[]'   Administrative office         Other participants in telemedicine encounter and roles:  [name] (GF)    Zoom visit (video) was supplemented by a phone call due to audio issues.       Consult Reason: pain/symptoms    History of Present Illness: Richard Weaver is recently diagnosed with lung cancer and possible mets to T3 and T4. He is expected to start chemo this Wednesday. Pall Care is consulted for pain management. His pain is on the left side of his back and shoulder, sharp, constant, aggravated by movement and relieved by opioids. He is taking Morphine Er 60 mg Q 12 hours. He is also taking Oxycodone 10 mg on average twice a day. Previously, he was on Morphine 30 mg ER BID which was not helping him.   Richard Weaver feels tired of pain and says 'something has to be done about it.'   he felt constipated but could tolerate senna due to abdominal pain. Milk of Magnesia seems to help.    What bothers you the most? pain  What helps you cope? 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 He lives with is Scientist, physiological who takes acre of him. He has many children and multiple grand kids.   FH: Non-contributory  Past Medical History:   Diagnosis Date    AAA (abdominal aortic aneurysm)     CHF (congestive heart failure)     Chronic kidney disease     COPD (chronic obstructive pulmonary disease)     GERD (gastroesophageal reflux disease)     Hypertension        No past surgical history on file.        Allergies:   No Known Allergies (drug, envir, food or latex)    Active Medications:  Current Outpatient Medications on File Prior to Visit   Medication Sig Dispense Refill    oxyCODONE (ROXICODONE) 5 MG immediate release tablet Take 2 tablets (10 mg total) by mouth every 4  hours as needed for Pain Max daily dose: 12 tablets 120 tablet 0    ondansetron (ZOFRAN) 8 MG tablet Take 1 tablet (8 mg total) by mouth 3 times daily as needed Begin 72 hours after treatment if needed for nausea. (Patient not taking: Reported on 06/19/2019) 20 tablet 5    prochlorperazine (COMPAZINE) 10 MG tablet Take 1 tablet (10 mg total) by mouth every 6 hours as needed (nausea) (Patient not taking: Reported on 06/19/2019) 30 tablet 5    LORazepam (ATIVAN) 0.5 MG tablet Take 1 tablet (0.5 mg total) by mouth every 6 hours as needed for Anxiety (scans) Max daily dose: 2 mg (Patient not taking: Reported on 06/19/2019) 6 tablet 0    aspirin 81 MG EC tablet Take 81 mg by mouth daily      atorvastatin (LIPITOR) 80 MG tablet Take 80 mg by mouth daily      carvedilol (COREG) 3.125 MG tablet Take 3.125 mg by mouth 2 times daily (with meals)      esomeprazole (NEXIUM) 40 MG capsule Take 40 mg by mouth daily (before breakfast)  lisinopril (PRINIVIL,ZESTRIL) 30 MG tablet Take 30 mg by mouth daily      budesonide-formoterol (SYMBICORT) 80-4.5 MCG/ACT inhaler Inhale 2 puffs into the lungs 2 times daily Shake well before each use.      Lidocaine 4 % patch Place 1 patch onto the skin every 24 hours Remove & discard patch within 12 hours or as directed.      morphine (MS CONTIN) 30 MG 12 hr tablet Take 1 tablet (30 mg total) by mouth 2 times daily Max daily dose: 60 mg 60 tablet 0    senna (SENOKOT) 8.6 MG tablet Take 2 tablets by mouth daily (Patient not taking: Reported on 05/31/2019) 60 tablet 3     No current facility-administered medications on file prior to visit.        Palliative Care Review of Systems:  ROS unobtainable/patient unresponsive no   Pain Severity: Severe (if none, erase pain descriptors)      Shortness of breath Mild   Anxiety Moderate  Depression Mild  Drowsiness: None  Nausea None,   Loss of appetite Mild  Constipation Mild  Tiredness Mild  Loss of well-being Moderate  Confusion None  Weakness   yes  Dysuria  no  Fever  no  Rash no   Visual changes  unknown  Bleeding  unknown  Lymphadenopathy  unknown  Others:  Unknown    Palliative Care Performance Status Scale:  Scale = 70  Decreased activity/cares for self/normal intake/alert    Patient Capacity:  Full    Prior Advance Care Planning:  Not reviewed in this visit  Current Therapies:  No ventilator, dialysis, feeding tube, or TPN.    Physical Examination:  Tele visit    Lab Results:  Results for Richard Weaver, Richard Weaver (MRN [id number]) as of 06/24/2019 15:51   Ref. Range 06/19/2019 11:11   LD Latest Ref Range: 118 - 225 U/L 112 (L)   Sodium Latest Ref Range: 133 - 145 mmol/L 137   Potassium Latest Ref Range: 3.3 - 5.1 mmol/L 4.9   Chloride Latest Ref Range: 96 - 108 mmol/L 97   CO2 Latest Ref Range: 20 - 28 mmol/L 30 (H)   Anion Gap Latest Ref Range: 7 - 16  10   UN Latest Ref Range: 6 - 20 mg/dL 32 (H)   Creatinine Latest Ref Range: 0.67 - 1.17 mg/dL 1.56 (H)   GFR,Black Latest Units: * 50 (!)   GFR,Caucasian Latest Units: * 44 (!)   Glucose Latest Ref Range: 60 - 99 mg/dL 114 (H)   Calcium Latest Ref Range: 8.6 - 10.2 mg/dL 9.9   Total Protein Latest Ref Range: 6.3 - 7.7 g/dL 6.2 (L)   Albumin Latest Ref Range: 3.5 - 5.2 g/dL 3.1 (L)   ALT Latest Ref Range: 0 - 50 U/L 9   AST Latest Ref Range: 0 - 50 U/L 12   Alk Phos Latest Ref Range: 40 - 130 U/L 86   Bilirubin,Total Latest Ref Range: 0.0 - 1.2 mg/dL 0.2   WBC Latest Ref Range: 4.2 - 9.1 THOU/uL 14.4 (H)   RBC Latest Ref Range: 4.6 - 6.1 MIL/uL 3.5 (L)   Hemoglobin Latest Ref Range: 13.7 - 17.5 g/dL 9.8 (L)   Hematocrit Latest Ref Range: 40 - 51 % 31 (L)   MCV Latest Ref Range: 79 - 92 fL 88   MCH Latest Ref Range: 26 - 32 pg/cell 28   MCHC Latest Ref Range: 32 - 37 g/dL 31 (L)   RDW  Latest Ref Range: 11.6 - 14.4 % 15.3 (H)   Platelets Latest Ref Range: 150 - 330 THOU/uL 305   Neut # K/uL Latest Ref Range: 1.8 - 5.4 THOU/uL 12.1 (H)   Neutrophil #-Instrument Latest Units: THOU/uL 12.1   Lymph # K/uL Latest Ref Range:  1.3 - 3.6 THOU/uL 1.0 (L)   Mono # K/uL Latest Ref Range: 0.3 - 0.8 THOU/uL 0.7   Eos # K/uL Latest Ref Range: 0.0 - 0.5 THOU/uL 0.4   Baso # K/uL Latest Ref Range: 0.0 - 0.1 THOU/uL 0.0   IMM Granulocytes # Latest Ref Range: 0.0 - 0.0 THOU/uL 0.1 (H)   Nucl RBC # K/uL Latest Ref Range: 0.0 - 0.0 THOU/uL 0.0   Seg Neut % Latest Units: % 84.0   Lymphocyte % Latest Units: % 7.1   Monocyte % Latest Units: % 5.1   Eosinophil % Latest Units: % 3.0   Basophil % Latest Units: % 0.3   IMM Granulocytes Latest Units: % 0.5   Nucl RBC % Latest Ref Range: 0.0 - 0.2 /100 WBC 0.0     Radiology Impressions:   PET scan:   . Hypermetabolic cavitary left upper lobe mass is highly concerning for locally aggressive pulmonary malignancy, with direct erosion of the T3 and T4 vertebrae and extension into the central spinal canal at this level. Recommend correlation with results    from upcoming bronchoscopy, and consider contrast-enhanced thoracic spine MRI to further evaluate the extent of this lesion.       2. Low-grade FDG avidity of a nonenlarged left lower hilar lymph node. This is nonspecific but remains suspicious for a nodal metastasis in the setting of the above. No additional hypermetabolic lymphadenopathy.       3. No hypermetabolic distant metastases.       4. Large irregular multilobular renal to infrarenal abdominal aortic aneurysm (at least 8 x 6 cm). There is a left lateral projection from this aneurysm that conforms to the immediately adjacent vertebral body, extending somewhat posteriorly. On the    comparative contrast-enhanced CT this aneurysm, including the left lateral projection, is extensively thrombosed. This aortic morphology, however, has been described in the literature as having been associated with impending aortic aneurysm rupture    and/or contained chronic rupture. Recommend vascular surgery consultation.       5. Gross secretions within the trachea, suggestive of aspiration.        Assessment/Plan:   73  year old male with a newly dxed lung cancer is being seen in the [facility] care clinic for pain control in setting of lung cancer with mets to T3, T4.    Pain: In left upper back, in setting of cancer and possible  Mets to T3 and T4. He starts his chemo in 2 days.  - He uses 20 mg of pon Oxycodone /day= 30 mg Morphine. As his pain is uncontrolled, we'll increase Morphine ER to 60+15 mg Q 12 h and Po Oxycodone 15 mg Q 6 h prn.  - [name] will call me in two days to update me about his pain  - ? Plans for radiation.    Constipation: continue Milk of Magnesia.      Family Meeting Scheduled: No    Advance Care Planning:  Reviewed?no    Updated?no  If updated what changes were made?:      Total Time Spent 52 minutes:   >50% of time was spent in counseling and/or coordination of care.

## 2019-06-25 ENCOUNTER — Ambulatory Visit: Payer: Medicare (Managed Care) | Admitting: Vascular Surgery

## 2019-06-25 ENCOUNTER — Ambulatory Visit
Admission: RE | Admit: 2019-06-25 | Discharge: 2019-06-25 | Disposition: A | Discharge status: Home / Self Care | Payer: Medicare (Managed Care) | Source: Ambulatory Visit | Attending: Vascular Surgery | Admitting: Vascular Surgery

## 2019-06-25 ENCOUNTER — Ambulatory Visit
Admission: RE | Admit: 2019-06-25 | Discharge: 2019-06-25 | Disposition: A | Discharge status: Home / Self Care | Payer: Medicare (Managed Care) | Source: Ambulatory Visit

## 2019-06-25 ENCOUNTER — Encounter: Payer: Self-pay | Admitting: Vascular Surgery

## 2019-06-25 VITALS — BP 88/54 | Ht 63.78 in | Wt 104.5 lb

## 2019-06-25 DIAGNOSIS — I714 Abdominal aortic aneurysm, without rupture, unspecified: Secondary | ICD-10-CM

## 2019-06-25 DIAGNOSIS — I70213 Atherosclerosis of native arteries of extremities with intermittent claudication, bilateral legs: Secondary | ICD-10-CM | POA: Insufficient documentation

## 2019-06-25 DIAGNOSIS — I739 Peripheral vascular disease, unspecified: Secondary | ICD-10-CM

## 2019-06-25 LAB — CV DOPPLER AORTA COMPLETE
Aorta Diameter A-P Distal: 3.29 cm
Aorta Diameter A-P Mid: 6.06 cm
Aorta Diameter A-P Prox: 3.1 cm
Aorta Diameter Trans Distal: 3.46 cm
Aorta Diameter Trans Mid: 6.12 cm
Aorta Diameter Trans Prox: 3.14 cm
Aorta EDV Distal: 14.93 cm/s
Aorta EDV Mid: 14.93 cm/s
Aorta EDV Prox: 14.26 cm/s
Aorta PSV Distal: 43.47 cm/s
Aorta PSV Mid: 52.25 cm/s
Aorta PSV Prox: 66.79 cm/s
Left Common Iliac Artery Prox AP Diameter: 0.97 cm
Left Common Iliac Artery Prox EDV: 31.99 cm/s
Left Common Iliac Artery Prox PSV: 107.03 cm/s
Left Common Iliac Artery Prox Trans Diameter: 1.15 cm
Right Common Iliac Artery Prox AP Diameter: 1.94 cm
Right Common Iliac Artery Prox EDV: 33.57 cm/s
Right Common Iliac Artery Prox PSV: 163.9 cm/s
Right Common Iliac Artery Prox Trans Diameter: 1.96 cm

## 2019-06-25 LAB — CV ANKLE BRACHIAL INDEX
Left Dorsalis Pedis Index: 0.53 ratio
Left Dorsalis Pedis Pressure: 51 mmHg
Left Posterior Tibial Index: 0.56 ratio
Left Posterior Tibial Pressure: 54 mmHg
Right Brachial Index: 1 ratio
Right Brachial Pressure: 97 mmHg
Right Dorsalis Pedis Index: 0.73 ratio
Right Dorsalis Pedis Pressure: 71 mmHg
Right Posterior Tibial Index: 0.72 ratio
Right Posterior Tibial Pressure: 70 mmHg

## 2019-06-25 NOTE — Progress Notes (Signed)
Vascular Surgery Outpatient Clinic Follow-Up Visit    HPI:     Richard Weaver is a 73 y.o. male with PMH significant for HTN, CHF, COPD, CKD, and current smoking, who is seen in the Vascular Surgery outpatient clinic for short interval follow-up. Patient seen for consultation while inpatient at [facility] for recent bronchoscopy, PET/CT imaging concerning for large abdominal aortic aneurysm.      Patient reports doing modestly well since last seen. He notes chronic back pain, unchanged from prior. He denies any abdominal pain. He reports long-standing history of bilateral lower extremity "numbness" with short distance ambulation, symptoms resolve with rest. There are no reported symptoms consistent with ischemic rest pain or non-healing lower extremity ulceration. He is scheduled to start chemotherapy this week for recently identified lung CA.    Patient's medications, allergies, and medical, surgical, family, and social histories were updated, as appropriate, in eRecord during today's office visit.    REVIEW OF SYSTEMS  ROS - Pertinent items noted in HPI.    MEDICAL HISTORY  Past Medical History:   Diagnosis Date    AAA (abdominal aortic aneurysm)     CHF (congestive heart failure)     Chronic kidney disease     COPD (chronic obstructive pulmonary disease)     GERD (gastroesophageal reflux disease)     Hypertension        SURGICAL HISTORY  No past surgical history on file.    FAMILY HISTORY  Family History   Problem Relation Age of Onset    Cancer Mother         esophageal    Cancer Sister         lung    Heart attack Father         SOCIAL HISTORY   reports that he has been smoking cigarettes. He started smoking about 50 years ago. He has a 75.00 pack-year smoking history. He has never used smokeless tobacco. He reports previous alcohol use. He reports that he does not use drugs.     ALLERGIES  Patient has no known allergies (drug, envir, food or latex).     MEDICATIONS  Current Outpatient Medications   Medication  Sig    morphine (MS CONTIN) 15 MG 12 hr tablet Take 1 tablet (15 mg total) by mouth 2 times daily Max daily dose: 30 mg Take 60 +15 mg Morphine ER every 12 hours.    oxyCODONE (ROXICODONE) 5 MG immediate release tablet Take 2 tablets (10 mg total) by mouth every 4 hours as needed for Pain Max daily dose: 12 tablets (Patient taking differently: Take 15 mg by mouth every 4 hours as needed for Pain )    ondansetron (ZOFRAN) 8 MG tablet Take 1 tablet (8 mg total) by mouth 3 times daily as needed Begin 72 hours after treatment if needed for nausea.    prochlorperazine (COMPAZINE) 10 MG tablet Take 1 tablet (10 mg total) by mouth every 6 hours as needed (nausea)    LORazepam (ATIVAN) 0.5 MG tablet Take 1 tablet (0.5 mg total) by mouth every 6 hours as needed for Anxiety (scans) Max daily dose: 2 mg    aspirin 81 MG EC tablet Take 81 mg by mouth daily    atorvastatin (LIPITOR) 80 MG tablet Take 80 mg by mouth daily    esomeprazole (NEXIUM) 40 MG capsule Take 40 mg by mouth daily (before breakfast)    budesonide-formoterol (SYMBICORT) 80-4.5 MCG/ACT inhaler Inhale 2 puffs into the lungs 2 times daily  Shake well before each use.    Lidocaine 4 % patch Place 1 patch onto the skin every 24 hours Remove & discard patch within 12 hours or as directed.    morphine (MS CONTIN) 30 MG 12 hr tablet Take 1 tablet (30 mg total) by mouth 2 times daily Max daily dose: 60 mg    carvedilol (COREG) 3.125 MG tablet Take 3.125 mg by mouth 2 times daily (with meals)     lisinopril (PRINIVIL,ZESTRIL) 30 MG tablet Take 30 mg by mouth daily    senna (SENOKOT) 8.6 MG tablet Take 2 tablets by mouth daily (Patient not taking: Reported on 05/31/2019)     No current facility-administered medications for this visit.         Objective:      BP (!) 88/54 (BP Location: Left arm)    Ht 1.62 m (5' 3.78")    Wt 47.4 kg (104 lb 8 oz)    BMI 18.06 kg/m      Imaging:  Abdominal aorta is aneurysmal with a maximum diameter of 6.06 x 6.12cm.  Right  common iliac artery is aneurysmal at 1.94 x 1.96cm, limited imaging of vessel with excessive bowel artifact.  Left common iliac artery is within normal limits at 0.97 x 1.15cm, limited imaging of vessel with excessive bowel artifact.      RIGHT Ankle/Brachial Index (ABI)   ABI is mildly abnormal at 0.73.   Bi-directional Doppler analysis demonstrates inflow as the most proximal level of obstruction with biphasic waveforms at the common femoral artery.    LEFT Ankle/Brachial Index (ABI)   ABI is moderately abnormal at 0.56.   Bi-directional Doppler analysis demonstrates inflow as the most proximal level of obstruction with monophasic waveforms at the common femoral artery.    Physical Exam:      General appearance: healthy, alert, active and no distress  HEENT: Normocephalic, atraumatic  CV: regular rate and rhythm  Lungs: respirations unlabored  Abd: soft, non-tender; aortic pulsation palpable  Extremities: Bilateral lower extremities are warm and well perfused without any evidence of clubbing, cyanosis, or limb ischemia. No edema.   Pulses:  Right Popliteal: Normal  Left Popliteal: Doppler  Right Dorsalis Pedis: Doppler  Left Dorsalis Pedis: Doppler  Right Posterior Tibial: Doppler  Left Posterior Tibial: Doppler  Neuro: normal without focal findings, mental status, speech normal, alert and oriented x3 and sensation grossly normal    Assessment:   Richard Weaver is a 72 y.o. male with newly diagnosed lung CA, large abdominal aortic aneurysm.    Plan:      Results of imaging reviewed with patient - known, large AAA.   Patient declines any further imaging at this time, wishes to proceed with plan for treatment of lung CA prior to undergoing any additional testing/intervention.    Will discuss with oncology team - CTA imaging to be requested following planned course chemotherapy.   Seek immediate medical attention with any severe abdominal or back pain.    Patient seen and examined with Dr. Felisa Bonier.    Tacey Heap, NP  06/25/2019 at 1:20 PM

## 2019-06-25 NOTE — Progress Notes (Signed)
[facility] - Thoracic Program - Outpatient Follow Up Note    Name: Richard Weaver     MRN: [id number]  Date of Birth: May 18, 1946    Referring Provider/PCP: Dr. Sherolyn Buba  PCP: Margurite Auerbach, MD  Rad/Onc: None  CardioThoracic Surgery: None  Pulmonary: None    Diagnosis/Stage: Likely at least stage IIIA lung cancer  ECOG Performance Status: 2-3    Oncologic History  12/2018 - left shoulder blade pain started, declined ED evaluation.   02/16/19 - ED visit for left-sided chest pain. CTA Chest showed irregular posterior pleural base LUL mass (5.4 x 3.3 cm) with post left 4th rib involvement, extension to left superior hilum and left hilar adenopathy.   03/11/19 - Evaluated by [facility] (Dr. Julien Nordmann). Was recommended to have biopsy of LUL mass, with plans for referral to Radiation Oncology. Note stated "highly suspicious stage IIIA (T3, N1, M0)."  03/2019 - ED [facility]) visit for self-limited hemoptysis. Was treated with course of Augmentin. Declined hospital admission.   05/15/19- NPV here at [facility]    CC: left back/chest, shoulder blade pain       History of Present Illness:    Interm/Subjective:    Comes back to clinic prior to startng carbo/taxol accompanied by  significant other, Richard Weaver.    The patient complains of ongoing left upper chest pain/upper back pain post biopsy. This is better controlled. Saw pall care yesterday and mscontin increased to 75 mg po BID. Continues lidocaine patch. Taking oxy a few times/day for breakthrough.  Energy a little better than last week since BP has been better. Continues to hold coreg and lisinopril.   No dizziness/lightheadness. Poor appetite, forcing himself to eat and drink fluids, at least 60 oz/day.  Denies any fevers, chills. No nausea/vomiting. Has not moved his bowels in 3 days. Plans on taking mg citrate when he gets home. He does not move his bowels regualrly as baseline, at least every 2-3 days. Passing gas. No abd pain. Cough improved.  Stable DOE.     Review of Systems:  Review of Systems   Constitutional: Positive for malaise/fatigue and weight loss.   HENT: Negative.    Eyes: Negative.    Respiratory: Positive for cough, sputum production and shortness of breath.    Cardiovascular: Positive for chest pain.   Gastrointestinal: Positive of constipation  Genitourinary: Negative.    Musculoskeletal: Positive for back pain and joint pain.   Skin: Negative.    Neurological: Negative.    Endo/Heme/Allergies: Negative.    Psychiatric/Behavioral: Negative.    All other systems reviewed and are negative.    Past Medical History  Past Medical History:   Diagnosis Date    AAA (abdominal aortic aneurysm)     CHF (congestive heart failure)     Chronic kidney disease     COPD (chronic obstructive pulmonary disease)     GERD (gastroesophageal reflux disease)     Hypertension      Social History  Social History     Socioeconomic History    Marital status: Divorced     Spouse name: Not on file    Number of children: Not on file    Years of education: Not on file    Highest education level: Not on file   Occupational History    Not on file   Tobacco Use    Smoking status: Current Every Day Smoker     Packs/day: 1.50     Years: 50.00  Pack years: 75.00     Types: Cigarettes     Start date: 09/19/1968    Smokeless tobacco: Never Used    Tobacco comment: Currently down to 4-5 per day.    Substance and Sexual Activity    Alcohol use: Not Currently     Frequency: Never    Drug use: Never    Sexual activity: Not on file   Social History Narrative    Not on file     Past Surgical History  No past surgical history on file.    Family History  Cancer-related family history includes Cancer in his mother and sister.    Medications  Current Outpatient Medications on File Prior to Visit   Medication Sig Dispense Refill    morphine (MS CONTIN) 15 MG 12 hr tablet Take 1 tablet (15 mg total) by mouth 2 times daily Max daily dose: 30 mg Take 60 +15 mg Morphine  ER every 12 hours. 32 tablet 0    oxyCODONE (ROXICODONE) 5 MG immediate release tablet Take 2 tablets (10 mg total) by mouth every 4 hours as needed for Pain Max daily dose: 12 tablets 120 tablet 0    ondansetron (ZOFRAN) 8 MG tablet Take 1 tablet (8 mg total) by mouth 3 times daily as needed Begin 72 hours after treatment if needed for nausea. (Patient not taking: Reported on 06/19/2019) 20 tablet 5    prochlorperazine (COMPAZINE) 10 MG tablet Take 1 tablet (10 mg total) by mouth every 6 hours as needed (nausea) (Patient not taking: Reported on 06/19/2019) 30 tablet 5    LORazepam (ATIVAN) 0.5 MG tablet Take 1 tablet (0.5 mg total) by mouth every 6 hours as needed for Anxiety (scans) Max daily dose: 2 mg (Patient not taking: Reported on 06/19/2019) 6 tablet 0    aspirin 81 MG EC tablet Take 81 mg by mouth daily      atorvastatin (LIPITOR) 80 MG tablet Take 80 mg by mouth daily      carvedilol (COREG) 3.125 MG tablet Take 3.125 mg by mouth 2 times daily (with meals)      esomeprazole (NEXIUM) 40 MG capsule Take 40 mg by mouth daily (before breakfast)      lisinopril (PRINIVIL,ZESTRIL) 30 MG tablet Take 30 mg by mouth daily      budesonide-formoterol (SYMBICORT) 80-4.5 MCG/ACT inhaler Inhale 2 puffs into the lungs 2 times daily Shake well before each use.      Lidocaine 4 % patch Place 1 patch onto the skin every 24 hours Remove & discard patch within 12 hours or as directed.      morphine (MS CONTIN) 30 MG 12 hr tablet Take 1 tablet (30 mg total) by mouth 2 times daily Max daily dose: 60 mg 60 tablet 0    senna (SENOKOT) 8.6 MG tablet Take 2 tablets by mouth daily (Patient not taking: Reported on 05/31/2019) 60 tablet 3     No current facility-administered medications on file prior to visit.      Allergy:  Allergy History as of 05/15/19      No Known Allergies (drug, envir, food or latex)                Physical Exam    BP 105/55    Pulse 101    Temp 36.1 C (97 F)    Resp 17    Ht 160.5 cm (5' 3.19")     Wt 47.2 kg (104 lb)    SpO2 97%  BMI 18.31 kg/m   Wt Readings from Last 3 Encounters:   06/26/19 47.2 kg (104 lb)   06/25/19 47.4 kg (104 lb 8 oz)   06/19/19 47.4 kg (104 lb 8 oz)        Constitutional: He is oriented to person, place, and time. He appears less tired compared to last week  HENT:   Head: Normocephalic.   Eyes: Conjunctivae and EOM are normal.   Neck: Normal range of motion.   Pulmonary/Chest: Effort normal. No respiratory distress.   Abdominal: There is no guarding.   Neurological: He is alert and oriented to person, place, and time.   Psychiatric: He has a normal mood and affect. His behavior is normal. Judgment and thought content normal.     Laboratory:       Lab results: 06/26/19  0950   WBC 13.0*   Hemoglobin 10.0*   Hematocrit 31*   RBC 3.6*   Platelets 303   Seg Neut % 83.6   Lymphocyte % 7.0   Monocyte % 6.1   Eosinophil % 2.6   Basophil % 0.3         Lab results: 06/26/19  0950   Sodium 133   Potassium 5.3*   Chloride 93*   CO2 31*   UN 27*   Creatinine 1.41*   GFR,Caucasian 49*   GFR,Black 57*   Glucose 124*   Calcium 9.9   Total Protein 6.3   Albumin 3.1*   ALT 10   AST 14   Alk Phos 92   Bilirubin,Total <0.2          Radiology: imaging from scanned studies  CTA Chest 530/20:      MRI Brain 03/04/19: no intracranial metastasis    PET 03/05/19:      Pathology  Medical Cytology   Medical Cytology   Collected:  05/31/19 1400    Result status:  Final    Resulting lab:  [facility]    Value:  [id number]   Additional Copy to:   Lovie Chol, MD     Final Diagnosis:   (A) Lymph node, 11R, endobronchial ultrasound-guided fine needle   aspiration:      - Insufficient material for cytologic evaluation.     (B) Lymph node, 4R, endobronchial ultrasound-guided fine needle   aspiration:      -Negative. No malignant cells identified.      - Cellular evidence of lymph node.     (C) Lymph node, station 7, endobronchial ultrasound-guided fine needle   aspiration:      - Negative. No  malignant cells identified.      - Cellular evidence of lymph node.     (D) Lymph node, 4L, endobronchial ultrasound-guided fine needle   aspiration:      - Negative. No malignant cells identified.      - Cellular evidence of lymph node.     (E) Lymph node, 10L, endobronchial ultrasound-guided fine needle   aspiration:      - Negative. No malignant cells identified.      - Cellular evidence of lymph node.     (F) Lung, left upper lobe, endobronchial ultrasound-guided fine needle   aspiration:      - Non-small cell carcinoma, favor squamous cell carcinoma. See   comment.     (G) Lung, left upper lobe, bronchial wash:   - Atypical cells present.        Impression  Richard Weaver is a 73 y.o. male with a  PMHx of HTN, HLD, COPD, CAD, HFrEF, GERD, CKD3, Anemia, hx of polio, LBP, anxiety, insomnia, migraines, PVD, left iliac aa thrombosis, large AAA (8x7.2cm, declined surgery), cigarette smoking (50 PYHx), hx basal cell cancer, hx SqCC left hand who presents for evaluation of recently diagnosed left upper lung mass with bony involvement.  Patient will require biopsy and further imaging to determine his treatment options.    Recommendation    1. Left upper lung mass, likely lung cancer at least stage IIIA, T3N1  - MRI Head negative  - sequential chemoRT  - Proceed with carbo/taxol today, will dose reduce to Carbo AUC 5/taxol 160 mg due to fatigue   - HCP completed last week    2. Cancer induced pain  - Follows with palliative care  - morphine ER /oxycodone    3. Heme Status  - normocytic anemia/leukopenia/inflammation   - no fevers/chills    4. ID status  - No signs/symptoms of infection.    5. FENR/hypotension   - creatinine 1.41 (1.57)  - Push fluids at home   - protein cal malnutiriton   - albumin 3.1  - eating hints book provided   - holding coreg and lisinopril for hypotension    6. Tobacco abuse  - Provided counseling, education, and moral support on smoking cessation. Active smoking during  treatment could negatively impact outcome.  Patient has cut back to 4 to 5 cigarettes/day.    7. AAA (8x7.2cm), PVD  - Had recently declined surgical intervention yesterday at vascular appt    8. Constipation  No bm in 3 day  Passing gas  Will take MOM when he gets home- reinforced chemo can be constipating along with narcotics and anti-emetics  Reviewed colace/senna  He does not like senna as caused abd cramps in the past     9. HFrEF/CAD/HLD  - ASA, statin    10. COPD  - Only taking Sybicort PRN, <1x/week  - No current supplemental oxygen use    11. GERD  - takes esomeprazole    12. Follow-up  RTC in 1 week with repeat labs/IVF      Dietrich Pates, FNP

## 2019-06-25 NOTE — Patient Instructions (Addendum)
Your team:  Richard Chol, MD, Richard Moulding, NP and your nurse is Richard Boyer, RN.      Please call the [facility] Triage Phone with any questions or concerns, [phone].  This number is open 24 hrs, 7 days a week.  Your call will be routed to your nurse between 8am-5pm, and an on-call physician will return your call evenings, nights, weekends and holidays.      If you need to cancel or change an appointment, please call [phone]    Please allow five days for team to complete any disability paperwork.     SOCIAL WORK: Erin Sons [phone]    NUTRITION: Mikle Bosworth [phone]    Monday [address] [address] Gwend[address]yn Fill [place] G[facility]olyn Fill [facility]  Wednesday [place] [address] Gwendolyn Fill [facility]  Thursday Clinic- Second Floor [address] Toney Sang      October 2020      'Sunday Monday Tuesday Wednesday Thursday Friday Saturday                       1     2     3       4     5     6  CV ABDOMINAL US  9:00 [facility]    CV ANKLE BRACHIAL INDEX  9:30 [place] [facility]    Dr. Stoner 10:00 [place] [facility],Division of Vascular Surgery 7    Blood Test    NP 8:30    Cycle 1 Carboplatin / Taxol 11:30 8     9     10       11     12     13  Blood Work    NP  2:30 pm    IVF - 4pm   14    15     16     17       18     19     20   21     22     23    MR THORACIC SPINE  4:00 [facility] [address] 24       25     26     27  Blood Work    NP 9:00    Carboplatin / Taxol Cycle 2 10:00 28     29     30     31                 November 2020      Sunday Monday Tuesday Wednesday Thursday Friday Saturday   1     2     3     4     5     6     7       8     9     10     11  CT Scan 9:50am    12     13     14       15     16     17     18  Blood Work    Dr. Patel 9am    Carbo/Taxol Cycle 3    19     20     21'$   $'22     23     24     25     26    'b$ HOLIDAY 27    HOLIDAY '28       29     30                                           '$ December 2020      'Sunday Monday Tuesday Wednesday Thursday Friday Saturday             1     2     3     4     5       6     7     8  Blood Work     NP 9:30am     Carbo/Taxol Cycle 4 - 10:30am   9     10     11     12       13     14     15     16     17     18     19       20     21     22     23     24     25  HOLIDAY   26       27     28     29     30     20 October 2019      Sunday Monday Tuesday Wednesday Thursday Friday Saturday                            1  HOLIDAY   2       3     4     5     6     7     8     9       10     11     12     13     14     15     16       17     18     19     20     21     22     23       24     25     26     27     28     29     30       31'$                                                February 2021      'Sunday Monday Tuesday Wednesday Thursday Friday Saturday'$         '1     2     3     '$ 4  $'5     6       7     8     9     10     11     12     13       14     15     16     17     18     19     20       21     22     23     24     25     26     27       28                                             'n$ Have weekly blood work done prior to your doctor appointment every visit     You will receive anti-emetics (anti-nausea medications) to help with side effects.     Ondansetron (Zofran): '8mg'$  tablets. Take 1 tablet every 8 hours as needed for nausea.     Prochlorperazine (Compazine) : '10mg'$  tablet. Take '10mg'$  every 6 hour as necessary for nausea. This can be taken with ondansetron if necessary.     Possible side effects of chemotherapy:    Hair Loss or hair thinning   Lack of appetite, taste change    Nausea/vomiting    Mouth sores    Numbness/tingling    Low blood counts    Low Red blood cells - can cause anemia    Low White blood cells (neutropenia)  - can cause infection    Low Platelets - risk for increased bleeding    Constipation/diarrhea    Fatigue    Pain    Difficulty concentrating    Reproductive/sexuality  changes   Others (see drug specific handout)     How to take care of yourself:   Practice good hand washing and personal hygiene   Avoid people that are ill   Exercise, yet take rest periods as needed   Use a soft toothbrush   Avoid activities that may cause injury or bleeding   Increase non-caffeinated fluid intake to 2-3 quarts per day   Eat a well-balanced diet, which may include a multiple vitamin   Apply lip balm to keep lips moist   Rinse your mouth with salt water solution after all meals and before bedtime   Use condoms for sexual activity for 48 hours after chemotherapy   Do NOT take any over the counter medications, herbal supplements, or have dental procedures without first discussing with your oncology team      Managing Constipation      1. Keep a record of your bowel movements    2. Increase oral fluids (minimum of 2 Liters/Quarts per day)    3. Exercise, such as walking or other aerobic exercise.    4. Eat a high Fiber diet (20-35 grams of fiber per day)   Raw vegetables, fresh or dried fruits, whole grains, nuts, powdered fiber supplements, prune juice    5.    Bowel Medication Regimen:    Begin taking bowel medications:  with pain medication, with anti-nausea medicine, and when you begin chemo.   Start with Colace (docusate) - stool softener - 1 tab a day  Senokot (senna) - natural laxative - 1 tab a day    If you do not have a bowel movement every day or every other day, take one tab of Colace and one tab of Senokot each morning and each night.    You can take up to two Colace and two Senokot each morning and each night.  If you still do not have regular bowel movements, please call the [facility] or tell your doctor.     Decrease the  dose of each if you develop frequent/loose stools   Adjust so that you have a soft BM each day    6. For intermittent worsening of constipation take:  Miralax (polyethylene glycol) 1 heaping teaspoon in 8 ounces of water/juice per day (follow directions on  the bottle)      7.     Call the office if you do not have BM in 2 days.       Many people cope well while receiving chemotherapy treatments. But, most people have some anxiety, concerns, worries, or nervousness. Please discuss anything that is concerning with your oncology team.       When to call the MD:   Call your oncology team with questions about your care or any non-emergency medical problem including:   A temperature over 100.4 F (38 C) (It is advised to purchase a digital thermometer)  Pain, redness, or swelling in the area where you received your shot or IV   Nausea, vomiting, or no appetite for several days   Sores or white spots in your mouth   Constipation or diarrhea for more than one day   Bruising or have small red spots under your skin   Any unusual bleeding   Feeling very sad for several days   Feeling like your heart is beating very fast   Frequent and/or painful urination   A cough that is new, or that doesnt go away   Unusual swelling, pain or warmth in your arm or leg   Feel light headed or faint   New or uncontrolled pain     Seek care immediately (call 911 or have someone bring you to the Emergency Department) for any medical emergency, including:   Chest pain, shortness of breath, or trouble breathing   A severe headache that does not go away, feeling confused, or have new trouble seeing   Bleeding that does not stop or slow down after several minutes     [facility] phone # [phone].     During business hours your call will be returned by a nurse. After business hours and on the weekend your call will be answered by an on-call physician.

## 2019-06-26 ENCOUNTER — Ambulatory Visit: Payer: Medicare (Managed Care) | Attending: Hematology & Oncology | Admitting: Oncology

## 2019-06-26 ENCOUNTER — Other Ambulatory Visit
Admission: RE | Admit: 2019-06-26 | Discharge: 2019-06-26 | Disposition: A | Discharge status: Home / Self Care | Payer: Medicare (Managed Care) | Source: Ambulatory Visit

## 2019-06-26 ENCOUNTER — Ambulatory Visit: Payer: Medicare (Managed Care)

## 2019-06-26 VITALS — BP 105/55 | HR 101 | Temp 97.0°F | Resp 17 | Ht 63.19 in | Wt 104.0 lb

## 2019-06-26 DIAGNOSIS — I714 Abdominal aortic aneurysm, without rupture, unspecified: Secondary | ICD-10-CM

## 2019-06-26 DIAGNOSIS — C3491 Malignant neoplasm of unspecified part of right bronchus or lung: Secondary | ICD-10-CM

## 2019-06-26 DIAGNOSIS — Z5111 Encounter for antineoplastic chemotherapy: Secondary | ICD-10-CM | POA: Insufficient documentation

## 2019-06-26 LAB — CBC AND DIFFERENTIAL
Baso # K/uL: 0 10*3/uL (ref 0.0–0.1)
Basophil %: 0.3 %
Eos # K/uL: 0.3 10*3/uL (ref 0.0–0.5)
Eosinophil %: 2.6 %
Hematocrit: 31 % — ABNORMAL LOW (ref 40–51)
Hemoglobin: 10 g/dL — ABNORMAL LOW (ref 13.7–17.5)
IMM Granulocytes #: 0.1 10*3/uL — ABNORMAL HIGH (ref 0.0–0.0)
IMM Granulocytes: 0.4 %
Lymph # K/uL: 0.9 10*3/uL — ABNORMAL LOW (ref 1.3–3.6)
Lymphocyte %: 7 %
MCH: 28 pg/cell (ref 26–32)
MCHC: 32 g/dL (ref 32–37)
MCV: 87 fL (ref 79–92)
Mono # K/uL: 0.8 10*3/uL (ref 0.3–0.8)
Monocyte %: 6.1 %
Neut # K/uL: 10.9 10*3/uL — ABNORMAL HIGH (ref 1.8–5.4)
Nucl RBC # K/uL: 0 10*3/uL (ref 0.0–0.0)
Nucl RBC %: 0 /100 WBC (ref 0.0–0.2)
Platelets: 303 10*3/uL (ref 150–330)
RBC: 3.6 MIL/uL — ABNORMAL LOW (ref 4.6–6.1)
RDW: 15.2 % — ABNORMAL HIGH (ref 11.6–14.4)
Seg Neut %: 83.6 %
WBC: 13 10*3/uL — ABNORMAL HIGH (ref 4.2–9.1)

## 2019-06-26 LAB — COMPREHENSIVE METABOLIC PANEL
ALT: 10 U/L (ref 0–50)
AST: 14 U/L (ref 0–50)
Albumin: 3.1 g/dL — ABNORMAL LOW (ref 3.5–5.2)
Alk Phos: 92 U/L (ref 40–130)
Anion Gap: 9 (ref 7–16)
Bilirubin,Total: <0.2 mg/dL (ref 0.0–1.2)
CO2: 31 mmol/L — ABNORMAL HIGH (ref 20–28)
Calcium: 9.9 mg/dL (ref 8.6–10.2)
Chloride: 93 mmol/L — ABNORMAL LOW (ref 96–108)
Creatinine: 1.41 mg/dL — ABNORMAL HIGH (ref 0.67–1.17)
GFR,Black: 57 * — AB
GFR,Caucasian: 49 * — AB
Glucose: 124 mg/dL — ABNORMAL HIGH (ref 60–99)
Lab: 27 mg/dL — ABNORMAL HIGH (ref 6–20)
Potassium: 5.3 mmol/L — ABNORMAL HIGH (ref 3.3–5.1)
Sodium: 133 mmol/L (ref 133–145)
Total Protein: 6.3 g/dL (ref 6.3–7.7)

## 2019-06-26 LAB — NEUTROPHIL #-INSTRUMENT: Neutrophil #-Instrument: 10.9 10*3/uL

## 2019-06-26 LAB — LACTATE DEHYDROGENASE: LD: 144 U/L (ref 118–225)

## 2019-06-26 MED ORDER — SEQUENCING OF DRUGS FOR ADMINISTRATION *I*
1.0000 | Status: DC | PRN
Start: 2019-06-26 — End: 2019-06-26
  Filled 2019-06-26: qty 1

## 2019-06-26 MED ORDER — FAMOTIDINE (PF) 20 MG/2ML IV SOLN *I*
20.0000 mg | Freq: Once | INTRAVENOUS | Status: AC
Start: 2019-06-26 — End: 2019-06-26
  Administered 2019-06-26: 20 mg via INTRAVENOUS
  Filled 2019-06-26: qty 2

## 2019-06-26 MED ORDER — DEXTROSE 5 % IV SOLN WRAPPED *I*
283.0000 mg | Freq: Once | INTRAVENOUS | Status: AC
Start: 2019-06-26 — End: 2019-06-26
  Administered 2019-06-26: 283 mg via INTRAVENOUS
  Filled 2019-06-26: qty 28.3

## 2019-06-26 MED ORDER — CHEMOTHERAPY & OTHER MEDS EXIST IN TREATMENT PLAN *I*
Status: DC
Start: 2019-06-26 — End: 2019-07-11

## 2019-06-26 MED ORDER — SODIUM CHLORIDE 0.9 % IV SOLN WRAPPED *I*
30.0000 mL/h | Status: DC | PRN
Start: 2019-06-26 — End: 2019-06-26

## 2019-06-26 MED ORDER — PALONOSETRON HCL 0.25 MG/5ML IV SOLN *I*
0.2500 mg | Freq: Once | INTRAVENOUS | Status: AC
Start: 2019-06-26 — End: 2019-06-26
  Administered 2019-06-26: 0.25 mg via INTRAVENOUS
  Filled 2019-06-26: qty 5

## 2019-06-26 MED ORDER — SODIUM CHLORIDE 0.9 % IV SOLN WRAPPED *I*
160.0000 mg/m2 | Freq: Once | INTRAVENOUS | Status: AC
Start: 2019-06-26 — End: 2019-06-26
  Administered 2019-06-26: 232 mg via INTRAVENOUS
  Filled 2019-06-26: qty 38.67

## 2019-06-26 MED ORDER — APREPITANT 130 MG/18ML IV EMUL *I*
130.0000 mg | Freq: Once | INTRAVENOUS | Status: AC
Start: 2019-06-26 — End: 2019-06-26
  Administered 2019-06-26: 130 mg via INTRAVENOUS
  Filled 2019-06-26: qty 18

## 2019-06-26 MED ORDER — DEXAMETHASONE SODIUM PHOSPHATE 10 MG/ML IJ SOLN *I*
20.0000 mg | Freq: Once | INTRAMUSCULAR | Status: AC
Start: 2019-06-26 — End: 2019-06-26
  Administered 2019-06-26: 20 mg via INTRAVENOUS
  Filled 2019-06-26: qty 2

## 2019-06-26 MED ORDER — DIPHENHYDRAMINE HCL 50 MG/ML IJ SOLN *I*
50.0000 mg | Freq: Once | INTRAMUSCULAR | Status: AC
Start: 2019-06-26 — End: 2019-06-26
  Administered 2019-06-26: 50 mg via INTRAVENOUS
  Filled 2019-06-26: qty 1

## 2019-06-26 NOTE — Progress Notes (Signed)
Pt arrives from clinic for [place].  Labs within parameters for treatment.  PIV patent before and after infusion.  Chemo teaching completed and caring for yourself after chemo given to pt and wife.  Pt tolerated infusion with no adverse reaction and discharged home in stable condition.   Patient complied with masking policy throughout duration of appointment. Writer maintained use of mask and faceshield/eyewear throughout duration of appointment. Writer was within 6 feet of patient for >5 minutes due to the nature of the visit.

## 2019-06-26 NOTE — Progress Notes (Signed)
[facility] TREATMENT HAND-OFF TOOL:   SITUATION:   Scheduled treatment category for today:     Cancer treatment:  Chemotherapy    Is this a new cancer treatment?: Yes      Patient seen by APP    Consent obtained:  Yes    Location:  [facility]    Labs complete:  Pending    Current patient status:  Scheduled treatment    OK to treat for scheduled treatment:  Pending    If pending: Ok to treat if results are within treatment parameters    Pending comments:  Pt will be going to lab first.     Patient complied with masking policy throughout duration of appointment. Writer maintained use of mask and faceshield/eyewear throughout duration of appointment. Writer was within 6 feet of patient for >5 minutes due to the nature of the visit.

## 2019-06-27 ENCOUNTER — Telehealth: Payer: Self-pay | Admitting: Oncology

## 2019-06-27 NOTE — Telephone Encounter (Signed)
Writer spoke with pt SO [name]  -pt feeling quite well today  -advised 11/11 is repeat CT scan of chest, abdomen and pelvis  -reviewed A/P to keep eye on AAA as well as monitor for cancer  -[name] stated pt does not plan to have any interventions for AAA.   -advised best to keep an eye on it  -patient and SO really do not want the abdominal scan.   -advised based on kidney function, may not have contrast.   -your medical teams are aware of the kidney issues.   -advised will discuss with Dr. Posey Pronto if he feels strongly for the abdomen/pelvis scan.   -SO verbalized understanding.

## 2019-07-01 NOTE — Progress Notes (Signed)
[facility] [place] - Thoracic Program - Outpatient Follow Up Note    Name: Richard Weaver     MRN: [id number]  Date of Birth: [birth year]    Referring Provider/PCP: Dr. Sherolyn Buba  PCP: Margurite Auerbach, MD  Rad/Onc: None  CardioThoracic Surgery: None  Pulmonary: None    Diagnosis/Stage: Likely at least stage IIIA lung cancer  ECOG Performance Status: 3    Oncologic History  12/2018 - left shoulder blade pain started, declined ED evaluation.   02/16/19 - ED visit for left-sided chest pain. CTA Chest showed irregular posterior pleural base LUL mass (5.4 x 3.3 cm) with post left 4th rib involvement, extension to left superior hilum and left hilar adenopathy.   03/11/19 - Evaluated by [facility] (Dr. Julien Nordmann). Was recommended to have biopsy of LUL mass, with plans for referral to Radiation Oncology. Note stated "highly suspicious stage IIIA (T3, N1, M0)."  03/2019 - ED [facility]) visit for self-limited hemoptysis. Was treated with course of Augmentin. Declined hospital admission.   05/15/19- NPV here at [facility]    CC: left back/chest, shoulder blade pain       History of Present Illness:    Interm/Subjective:    Comes back to clinic prior after his first cycle of carbo/taxol last week accompanied by  significant other, [name].    Significant back pain /left shoulder blade  Taking mscontin 60 mg BID, decreased from 75 mg po BID per pall care as 75 mg " made me feel like I am on [place]"  Not taking any oxycodone since yesterday as he has not wanted to , does not feel like it does anything to help with the pain  Very decreased po intake , today only had some cheerios and some water, down 4 kg since last week  " I just want to die"  Feeling lightheaded  Very fatigued and in wheelchair today  Ambulating is difficult   No falls   Breathing stable but more cough and occasional blood mixed in sputum , ongoing since  biosy  No nausea/vomiting  Diarrhea on Saturday night and then 2 episodes yesterday  No bm today so  far     Review of Systems:  12 point reviewed and negative unless noted above     Past Medical History  Past Medical History:   Diagnosis Date    AAA (abdominal aortic aneurysm)     CHF (congestive heart failure)     Chronic kidney disease     COPD (chronic obstructive pulmonary disease)     GERD (gastroesophageal reflux disease)     Hypertension      Social History  Social History     Socioeconomic History    Marital status: Divorced     Spouse name: Not on file    Number of children: Not on file    Years of education: Not on file    Highest education level: Not on file   Occupational History    Not on file   Tobacco Use    Smoking status: Current Every Day Smoker     Packs/day: 1.50     Years: 50.00     Pack years: 75.00     Types: Cigarettes     Start date: 09/19/1968    Smokeless tobacco: Never Used    Tobacco comment: Currently down to 4-5 per day.    Substance and Sexual Activity    Alcohol use: Not Currently     Frequency: Never  Drug use: Never    Sexual activity: Not on file   Social History Narrative    Not on file     Past Surgical History  No past surgical history on file.    Family History  Cancer-related family history includes Cancer in his mother and sister.    Medications  Current Outpatient Medications on File Prior to Visit   Medication Sig Dispense Refill    Chemotherapy & Other Meds Exist in Treatment Plan PACLitaxel (Taxol) & CARBOplatin (Paraplatin)      oxyCODONE (ROXICODONE) 5 MG immediate release tablet Take 2 tablets (10 mg total) by mouth every 4 hours as needed for Pain Max daily dose: 12 tablets (Patient taking differently: Take 15 mg by mouth every 4 hours as needed for Pain ) 120 tablet 0    ondansetron (ZOFRAN) 8 MG tablet Take 1 tablet (8 mg total) by mouth 3 times daily as needed Begin 72 hours after treatment if needed for nausea. (Patient not taking: Reported on 06/26/2019) 20 tablet 5    prochlorperazine (COMPAZINE) 10 MG tablet Take 1 tablet (10 mg total)  by mouth every 6 hours as needed (nausea) (Patient not taking: Reported on 06/26/2019) 30 tablet 5    LORazepam (ATIVAN) 0.5 MG tablet Take 1 tablet (0.5 mg total) by mouth every 6 hours as needed for Anxiety (scans) Max daily dose: 2 mg (Patient not taking: Reported on 06/26/2019) 6 tablet 0    aspirin 81 MG EC tablet Take 81 mg by mouth daily      atorvastatin (LIPITOR) 80 MG tablet Take 80 mg by mouth daily      carvedilol (COREG) 3.125 MG tablet Take 3.125 mg by mouth 2 times daily (with meals)       esomeprazole (NEXIUM) 40 MG capsule Take 40 mg by mouth daily (before breakfast)      lisinopril (PRINIVIL,ZESTRIL) 30 MG tablet Take 30 mg by mouth daily      budesonide-formoterol (SYMBICORT) 80-4.5 MCG/ACT inhaler Inhale 2 puffs into the lungs 2 times daily Shake well before each use.      Lidocaine 4 % patch Place 1 patch onto the skin every 24 hours Remove & discard patch within 12 hours or as directed.      morphine (MS CONTIN) 30 MG 12 hr tablet Take 1 tablet (30 mg total) by mouth 2 times daily Max daily dose: 60 mg (Patient taking differently: Take 75 mg by mouth 2 times daily ) 60 tablet 0    senna (SENOKOT) 8.6 MG tablet Take 2 tablets by mouth daily (Patient not taking: Reported on 05/31/2019) 60 tablet 3     No current facility-administered medications on file prior to visit.      Allergy:  Allergy History as of 05/15/19      No Known Allergies (drug, envir, food or latex)                Physical Exam    BP 119/72    Pulse 96    Temp 37.6 C (99.7 F) (Temporal)    Resp 16    Ht 160.5 cm (5' 3.19")    Wt (!) 43.8 kg (96 lb 8 oz)    SpO2 94%    BMI 16.99 kg/m   Wt Readings from Last 3 Encounters:   07/02/19 (!) 43.8 kg (96 lb 8 oz)   06/26/19 47.2 kg (104 lb)   06/25/19 47.4 kg (104 lb 8 oz)  Constitutional: He is oriented to person, place, and time. He appears very tired and uncomfortable in wheelchair  HENT:   Head: Normocephalic.   Eyes: Conjunctivae and EOM are normal.   Neck: Normal  range of motion.   Pulmonary/Chest: Effort normal. No respiratory distress. Lungs diminished bilateral bases otherwise clear  Abdominal: There is no guarding.   Neurological: He is alert and oriented to person, place, and time.   Psychiatric: He has a normal mood and affect. His behavior is normal. Judgment and thought content normal.     Laboratory:         Lab results: 06/26/19  0950   WBC 13.0*   Hemoglobin 10.0*   Hematocrit 31*   RBC 3.6*   Platelets 303   Seg Neut % 83.6   Lymphocyte % 7.0   Monocyte % 6.1   Eosinophil % 2.6   Basophil % 0.3         Lab results: 06/26/19  0950   Sodium 133   Potassium 5.3*   Chloride 93*   CO2 31*   UN 27*   Creatinine 1.41*   GFR,Caucasian 49*   GFR,Black 57*   Glucose 124*   Calcium 9.9   Total Protein 6.3   Albumin 3.1*   ALT 10   AST 14   Alk Phos 92   Bilirubin,Total <0.2          Radiology: imaging from scanned studies  CTA Chest 530/20:      MRI Brain 03/04/19: no intracranial metastasis    PET 03/05/19:      Pathology  Medical Cytology   Medical Cytology   Collected:  05/31/19 1400    Result status:  Final    Resulting lab:  [facility]    Value:  [id number]   Additional Copy to:   Lovie Chol, MD     Final Diagnosis:   (A) Lymph node, 11R, endobronchial ultrasound-guided fine needle   aspiration:      - Insufficient material for cytologic evaluation.     (B) Lymph node, 4R, endobronchial ultrasound-guided fine needle   aspiration:      -Negative. No malignant cells identified.      - Cellular evidence of lymph node.     (C) Lymph node, station 7, endobronchial ultrasound-guided fine needle   aspiration:      - Negative. No malignant cells identified.      - Cellular evidence of lymph node.     (D) Lymph node, 4L, endobronchial ultrasound-guided fine needle   aspiration:      - Negative. No malignant cells identified.      - Cellular evidence of lymph node.     (E) Lymph node, 10L, endobronchial ultrasound-guided fine needle   aspiration:       - Negative. No malignant cells identified.      - Cellular evidence of lymph node.     (F) Lung, left upper lobe, endobronchial ultrasound-guided fine needle   aspiration:      - Non-small cell carcinoma, favor squamous cell carcinoma. See   comment.     (G) Lung, left upper lobe, bronchial wash:   - Atypical cells present.        Impression  Jmarion Christiano is a 73 y.o. male with a PMHx of HTN, HLD, COPD, CAD, HFrEF, GERD, CKD3, Anemia, hx of polio, LBP, anxiety, insomnia, migraines, PVD, left iliac aa thrombosis, large AAA (8x7.2cm, declined surgery), cigarette smoking (50 PYHx), hx basal cell cancer,  hx SqCC left hand who presents for evaluation of recently diagnosed left upper lung mass with bony involvement.  Patient will require biopsy and further imaging to determine his treatment options.    Recommendation    1. Left upper lung mass, likely lung cancer at least stage IIIA, T3N1  - MRI Head negative  - sequential chemoRT  - S/p c1 carbo/taxol last week, dose reduced to Carbo AUC 5/taxol 160 mg due to fatigue     2. Cancer induced pain/FTT  - Severe to back/left shoulder blade  - Follows with palliative care, Dr. Simona Huh   - morphine ER 60 BID / not taking oxycodone as does not feel like it helps  - morphine ER decreased from 75 mg po BID per pall care as 75 mg " made me feel like I am on [place]"  - poor po intake and lightheaded  - high risk for falls   -IVF toady and IV pain meds  - COVID swab to be done here  - needs admission for FTT /pain mgmt -will transfer to [facility] . Spoke with Dr. Roanna Banning who accepted pt      Dietrich Pates, FNP      >40 minutes spent with pt and significant other  >50% of the time was spent discussing symptoms and developing a plan for admission for FTT/pain control

## 2019-07-01 NOTE — Telephone Encounter (Addendum)
Before I call pt's wife back, what would you suggest he do re: long acting med?  See below I pasted below from onc note this morning:  morphine ER decreased to '60mg'$  q12 hr per wife.   -this confusion never has happened with morphine ER by itself.   -wife would like to hold morphine ER dose on chemo days to prevent this.   -will message palliative care if other changes should be made.  -pt not using much oxy PRN.    -wife would like to avoid morphine ER during chemo. Per wife usually gives medication between 10 and 11s.    Addendum: I spoke with [name]. She feels that patient became more lethargic on Morphine 75 mg BID and is not using any Oxycodone. I encouraged using Oxycodone 15 mg and gave my OK to reduce the dose of Morphine ER 60 mg BID.   Forest Becker, MBBS

## 2019-07-01 NOTE — Telephone Encounter (Signed)
Writer spoke with patient SO   -2 days ago, had 1 liquid bowel episode with incontinence.  -not eating much, taste changes  -pt weak but no dizziness   -SO ok with Nutrition to visit with patient tomorrow  -reviewed we can do blood work tomorrow in infusion so no need to come early,   -advised per Dr. Posey Pronto, can cancel CT abd/ pelvis but keep CT chest  -wife hopeful Dr. Posey Pronto is honest about the cancer prognosis,   -advised reason for rescanning after 2 cycles to look to see if any changes in the cancer.   -discussed home care, would like to discuss at visit tomorrow with patient but SO would like home care   -prior to biopsy and also chemotherapy was given morphine ER on empty stomach, pt was very confused after both.  Pt did not remember leaving infusion or drive home and slept til 1PM the next day.   -reviewed received benadryl during chemo and could have made him more sleepy.    -morphine ER decreased to '60mg'$  q12 hr per wife.   -this confusion never has happened with morphine ER by itself.   -wife would like to hold morphine ER dose on chemo days to prevent this.   -will message palliative care if other changes should be made.  -pt not using much oxy PRN.    -wife would like to avoid morphine ER during chemo. Per wife usually gives medication between 10 and 11s.  -emotional support provided and encouraged SO's self care.     -wife appreciative of assistance

## 2019-07-02 ENCOUNTER — Ambulatory Visit: Payer: Medicare (Managed Care)

## 2019-07-02 ENCOUNTER — Encounter: Payer: Self-pay | Admitting: Internal Medicine

## 2019-07-02 ENCOUNTER — Inpatient Hospital Stay
Admission: AD | Admit: 2019-07-02 | Discharge: 2019-07-11 | DRG: 947 | Disposition: A | Discharge status: Hospice | Payer: Medicare (Managed Care) | Source: Ambulatory Visit | Attending: Internal Medicine | Admitting: Internal Medicine

## 2019-07-02 ENCOUNTER — Telehealth: Payer: Self-pay

## 2019-07-02 ENCOUNTER — Ambulatory Visit: Payer: Medicare (Managed Care) | Attending: Oncology | Admitting: Oncology

## 2019-07-02 VITALS — BP 119/72 | HR 96 | Temp 99.7°F | Resp 16 | Ht 63.19 in | Wt 96.5 lb

## 2019-07-02 DIAGNOSIS — C3490 Malignant neoplasm of unspecified part of unspecified bronchus or lung: Secondary | ICD-10-CM | POA: Diagnosis present

## 2019-07-02 DIAGNOSIS — E860 Dehydration: Secondary | ICD-10-CM | POA: Insufficient documentation

## 2019-07-02 DIAGNOSIS — Z66 Do not resuscitate: Secondary | ICD-10-CM | POA: Diagnosis present

## 2019-07-02 DIAGNOSIS — N183 Chronic kidney disease, stage 3 unspecified: Secondary | ICD-10-CM | POA: Diagnosis present

## 2019-07-02 DIAGNOSIS — R52 Pain, unspecified: Secondary | ICD-10-CM

## 2019-07-02 DIAGNOSIS — J189 Pneumonia, unspecified organism: Secondary | ICD-10-CM | POA: Diagnosis present

## 2019-07-02 DIAGNOSIS — Z515 Encounter for palliative care: Secondary | ICD-10-CM | POA: Diagnosis present

## 2019-07-02 DIAGNOSIS — G893 Neoplasm related pain (acute) (chronic): Principal | ICD-10-CM | POA: Diagnosis present

## 2019-07-02 DIAGNOSIS — I130 Hypertensive heart and chronic kidney disease with heart failure and stage 1 through stage 4 chronic kidney disease, or unspecified chronic kidney disease: Secondary | ICD-10-CM | POA: Diagnosis present

## 2019-07-02 DIAGNOSIS — F17210 Nicotine dependence, cigarettes, uncomplicated: Secondary | ICD-10-CM | POA: Diagnosis present

## 2019-07-02 DIAGNOSIS — I5022 Chronic systolic (congestive) heart failure: Secondary | ICD-10-CM | POA: Diagnosis present

## 2019-07-02 DIAGNOSIS — Y9289 Other specified places as the place of occurrence of the external cause: Secondary | ICD-10-CM

## 2019-07-02 DIAGNOSIS — E785 Hyperlipidemia, unspecified: Secondary | ICD-10-CM | POA: Diagnosis present

## 2019-07-02 DIAGNOSIS — T451X5A Adverse effect of antineoplastic and immunosuppressive drugs, initial encounter: Secondary | ICD-10-CM | POA: Diagnosis present

## 2019-07-02 DIAGNOSIS — J449 Chronic obstructive pulmonary disease, unspecified: Secondary | ICD-10-CM | POA: Diagnosis present

## 2019-07-02 DIAGNOSIS — R627 Adult failure to thrive: Secondary | ICD-10-CM | POA: Diagnosis present

## 2019-07-02 DIAGNOSIS — K219 Gastro-esophageal reflux disease without esophagitis: Secondary | ICD-10-CM | POA: Diagnosis present

## 2019-07-02 DIAGNOSIS — C3491 Malignant neoplasm of unspecified part of right bronchus or lung: Secondary | ICD-10-CM | POA: Insufficient documentation

## 2019-07-02 DIAGNOSIS — C7951 Secondary malignant neoplasm of bone: Secondary | ICD-10-CM | POA: Diagnosis present

## 2019-07-02 DIAGNOSIS — Z681 Body mass index (BMI) 19 or less, adult: Secondary | ICD-10-CM

## 2019-07-02 DIAGNOSIS — Z0289 Encounter for other administrative examinations: Secondary | ICD-10-CM | POA: Insufficient documentation

## 2019-07-02 DIAGNOSIS — Z20828 Contact with and (suspected) exposure to other viral communicable diseases: Secondary | ICD-10-CM | POA: Insufficient documentation

## 2019-07-02 DIAGNOSIS — X58XXXA Exposure to other specified factors, initial encounter: Secondary | ICD-10-CM | POA: Diagnosis present

## 2019-07-02 DIAGNOSIS — I2510 Atherosclerotic heart disease of native coronary artery without angina pectoris: Secondary | ICD-10-CM | POA: Diagnosis present

## 2019-07-02 DIAGNOSIS — D61810 Antineoplastic chemotherapy induced pancytopenia: Secondary | ICD-10-CM | POA: Diagnosis present

## 2019-07-02 MED ORDER — ATORVASTATIN CALCIUM 80 MG PO TABS *I*
80.0000 mg | ORAL_TABLET | Freq: Every day | ORAL | Status: DC
Start: 2019-07-02 — End: 2019-07-06
  Administered 2019-07-03 – 2019-07-06 (×4): 80 mg via ORAL
  Filled 2019-07-02 (×5): qty 1

## 2019-07-02 MED ORDER — LIDOCAINE 5 % EX PTCH *I*
1.0000 | MEDICATED_PATCH | Freq: Every day | CUTANEOUS | Status: DC
Start: 2019-07-02 — End: 2019-07-11
  Administered 2019-07-02 – 2019-07-10 (×6): 1 via TRANSDERMAL
  Filled 2019-07-02 (×10): qty 1

## 2019-07-02 MED ORDER — MELATONIN 3 MG PO TABS *I*
3.0000 mg | ORAL_TABLET | Freq: Every evening | ORAL | Status: DC | PRN
Start: 2019-07-02 — End: 2019-07-11
  Administered 2019-07-06: 3 mg via ORAL
  Filled 2019-07-02: qty 1

## 2019-07-02 MED ORDER — MORPHINE SULFATE ER 30 MG PO TBCR *I*
60.0000 mg | ORAL_TABLET | Freq: Two times a day (BID) | ORAL | Status: DC
Start: 2019-07-02 — End: 2019-07-03
  Administered 2019-07-02 – 2019-07-03 (×2): 60 mg via ORAL
  Filled 2019-07-02 (×2): qty 2

## 2019-07-02 MED ORDER — ACETAMINOPHEN 325 MG PO TABS *I*
650.0000 mg | ORAL_TABLET | Freq: Four times a day (QID) | ORAL | Status: DC | PRN
Start: 2019-07-02 — End: 2019-07-11
  Administered 2019-07-03: 650 mg via ORAL
  Filled 2019-07-02: qty 2

## 2019-07-02 MED ORDER — POLYETHYLENE GLYCOL 3350 PO PACK 17 GM *I*
17.0000 g | PACK | Freq: Every day | ORAL | Status: DC | PRN
Start: 2019-07-02 — End: 2019-07-05

## 2019-07-02 MED ORDER — DEXTROSE 5 % FLUSH FOR PUMPS *I*
0.0000 mL/h | INTRAVENOUS | Status: DC | PRN
Start: 2019-07-02 — End: 2019-07-11

## 2019-07-02 MED ORDER — MORPHINE SULFATE 2 MG/ML IV SOLN *WRAPPED*
2.0000 mg | Freq: Once | Status: AC
Start: 2019-07-02 — End: 2019-07-02
  Administered 2019-07-02: 2 mg via INTRAVENOUS
  Filled 2019-07-02: qty 1

## 2019-07-02 MED ORDER — ONDANSETRON HCL 4 MG PO TABS *I*
8.0000 mg | ORAL_TABLET | Freq: Three times a day (TID) | ORAL | Status: DC | PRN
Start: 2019-07-02 — End: 2019-07-11

## 2019-07-02 MED ORDER — ENOXAPARIN SODIUM 30 MG/0.3ML IJ SOSY *I*
30.0000 mg | PREFILLED_SYRINGE | Freq: Every day | INTRAMUSCULAR | Status: DC
Start: 2019-07-02 — End: 2019-07-05
  Administered 2019-07-02 – 2019-07-04 (×3): 30 mg via SUBCUTANEOUS
  Filled 2019-07-02 (×3): qty 0.3

## 2019-07-02 MED ORDER — SODIUM CHLORIDE 0.9 % FLUSH FOR PUMPS *I*
0.0000 mL/h | INTRAVENOUS | Status: DC | PRN
Start: 2019-07-02 — End: 2019-07-11

## 2019-07-02 MED ORDER — SODIUM CHLORIDE 0.9 % IV BOLUS *I*
1000.0000 mL | Freq: Once | Status: AC
Start: 2019-07-02 — End: 2019-07-02
  Administered 2019-07-02: 1050 mL via INTRAVENOUS

## 2019-07-02 MED ORDER — HYDROMORPHONE HCL PF 0.5 MG/0.5 ML IJ SOLN *I*
0.5000 mg | INTRAMUSCULAR | Status: DC | PRN
Start: 2019-07-02 — End: 2019-07-05
  Administered 2019-07-03 – 2019-07-05 (×5): 0.5 mg via INTRAVENOUS
  Filled 2019-07-02 (×5): qty 0.5

## 2019-07-02 MED ORDER — ASPIRIN 81 MG PO TBEC *I*
81.0000 mg | DELAYED_RELEASE_TABLET | Freq: Every day | ORAL | Status: DC
Start: 2019-07-02 — End: 2019-07-06
  Administered 2019-07-03 – 2019-07-06 (×4): 81 mg via ORAL
  Filled 2019-07-02 (×5): qty 1

## 2019-07-02 MED ORDER — SODIUM CHLORIDE 0.9 % IV SOLN WRAPPED *I*
100.0000 mL/h | Status: DC
Start: 2019-07-02 — End: 2019-07-03
  Administered 2019-07-02: 100 mL/h
  Administered 2019-07-02: 100 mL/h via INTRAVENOUS
  Administered 2019-07-03: 100 mL/h

## 2019-07-02 MED ORDER — BUDESONIDE-FORMOTEROL FUMARATE 80-4.5 MCG/ACT IN AERO *I*
2.0000 | INHALATION_SPRAY | Freq: Two times a day (BID) | RESPIRATORY_TRACT | Status: DC
Start: 2019-07-02 — End: 2019-07-03
  Administered 2019-07-03: 2 via RESPIRATORY_TRACT
  Filled 2019-07-02: qty 6.9

## 2019-07-02 MED ORDER — CALCIUM CARBONATE ANTACID 500 MG PO CHEW *I*
1000.0000 mg | CHEWABLE_TABLET | Freq: Three times a day (TID) | ORAL | Status: DC | PRN
Start: 2019-07-02 — End: 2019-07-11

## 2019-07-02 MED ORDER — PANTOPRAZOLE SODIUM 40 MG PO TBEC *I*
40.0000 mg | DELAYED_RELEASE_TABLET | Freq: Every morning | ORAL | Status: DC
Start: 2019-07-03 — End: 2019-07-11
  Administered 2019-07-03 – 2019-07-11 (×9): 40 mg via ORAL
  Filled 2019-07-02 (×9): qty 1

## 2019-07-02 NOTE — Progress Notes (Signed)
[facility] TREATMENT HAND-OFF TOOL:   SITUATION:   Scheduled treatment category for today:     Cancer treatment:  IV Fluids    Is this a new cancer treatment?: No      Patient seen by APP    Consent obtained:  Not needed    Labs complete:  Pending    Current patient status:  Scheduled treatment with change    Comments on change:  Please draw labs with IVF.      IVF + morphine     Pt admit to [facility].      OK to treat for scheduled treatment:  Yes

## 2019-07-02 NOTE — Progress Notes (Unsigned)
Patient complied with masking policy throughout duration of appointment. Writer maintained use of mask and faceshield/eyewear throughout duration of appointment. Writer was within 6 feet of patient for >5 minutes due to the nature of the visit.      Patient here for 1L IVF over 2 hours. PIV inserted and removed with positive blood return before infusion. Patient being admitted to [facility] for hydration and pain management. Patient only received approximately 200 ml of 1L bolus. Also gave '2mg'$  IV Morphine for 10/10 back pain. Report called to [facility] nurse. Patient sent via ambulance, with wife. PIV flushed with NS and saline locked.

## 2019-07-02 NOTE — Telephone Encounter (Signed)
[name]  Outpatient Nutrition Service - Initial Assessment     Reason for Referral:  Poor PO intake and taste changes     Brief Oncology History:  Richard Weaver is a 73 y.o. male with newly diagnosed lung CA, large abdominal aortic aneurysm.    Treatment:  Chemotherapy: Carboplatin/ Taxol (10/7- 12/8)     History: CHF, CKD, COPD, GERD and HTN    Medications: reviewed    Labs: 06/26/19, reviewed, K 5.3, Cl 93, Cr 1.41 (1.56 on 9/30)    Diet recall:  Yesterday: a few bits of ground beef and rice, Gatorade and milk     Today: Cheerios and milk    Food Allergy/Intolerance: NKFA    Physical Activity: normal activities of daily living    Nutrition-Impact Symptoms:   Anorexia: Yes   Dysphagia: Yes- difficulty swallowing meats    Odynophagia: Yes, No   Pain: No   Taste/smell changes: Yes   Xerostomia: No   Mucositis: No   Nausea: No   Vomiting: No   Early satiety: Yes   BM's: multiple soft or watery bowel movements per day since Saturday 10/10    Anthropometrics:  Wt Readings from Last 10 Encounters:   06/26/19 47.2 kg (104 lb)   06/25/19 47.4 kg (104 lb 8 oz)   06/19/19 47.4 kg (104 lb 8 oz)   06/28/19 47.2 kg (104 lb)   05/31/19 48 kg (105 lb 13.1 oz)   05/31/19 48 kg (105 lb 13.1 oz)   06/03/19 48 kg (105 lb 13.1 oz)   05/15/19 49 kg (108 lb)     Estimated body mass index is 18.31 kg/m as calculated from the following:    Height as of 06/28/19: 160.5 cm (5' 3.19").    Weight as of 06/28/19: 47.2 kg (104 lb).    Ideal Body Weight: 64.3 kg +/- 10%; 73% IBW  Usual Body Weight: 49 kg; 96% UBW    Estimated Nutritional Needs: (based on current wt 47.2 kg)  1420-1650 kcal/day (30-35 kcal/kg)  57-71 g pro/day (1.2-1.5 g/kg)  2-3 L fluid/day    Nutrition Assessment:   Richard Weaver is  73 y.o. male with newly diagnosed lung CA, large abdominal aortic aneurysm. Called pt today and spoke with Richard Weaver who reports that pt has been experiencing poor appetite, taste changes and early satiety recently, which has been made  worse by "extreme" diarrhea since Saturday 10/10. [name] describes [place] as a meat and potatoes kind of guy who dislikes vegetables, chicken and cheese. Recently he has only been eating bites of meals or refusing them all together. He dislikes Ensure and Boost supplements, but Richard Weaver bought Muscle Milk to try. He will typically accept peaches, oranges, fried eggs, french toast, cereal, and pudding.     Writer encouraged [name] to continue to offer foods frequently and to encourage pt to try to take even a few bites every few hours. Reviewed ways to make meals more calorie dense without increasing portion size. Discussed other foods and drinks he would potentially be accepting of including milkshakes and nutrition supplements. Later today, Probation officer will provide samples of Richard Weaver an TwoCal HN for pt to try to see if these may be options for them.     Nutrition Intervention/Patient Education:    Continue to eat and drink as tolerated   Reviewed goals during treatment:  o Minimize weight loss  o Adequate hydration    Reviewed treatment options for supporting weight and hydration:   o  High calorie diet  o Oral nutrition supplements   Will closely monitor clinical course for potential nutrition support needs and provide enteral nutrition recommendation when appropriate    Provided educational nutrition supplement samples.     Nutrition Monitoring/Evaluation:   1. Will monitor diet tolerance and intake, nutrition-related labs, weight trend, nutrition-impact symptoms.  2. Follow up:  As needed    Richard Weaver, [place]  Office: [phone]  Appts: [phone]  PIC: [phone]

## 2019-07-02 NOTE — Patient Instructions (Addendum)
Your team:  Lovie Chol, MD, Windell Moulding, NP and your nurse is Othella Boyer, RN.      Please call the [facility] Triage Phone with any questions or concerns, [phone].  This number is open 24 hrs, 7 days a week.  Your call will be routed to your nurse between 8am-5pm, and an on-call physician will return your call evenings, nights, weekends and holidays.      If you need to cancel or change an appointment, please call [phone]    Please allow five days for team to complete any disability paperwork.     SOCIAL WORK: Erin Sons [phone]    NUTRITION: Mikle Bosworth [phone]    Monday [address] [place] Gwendolyn Fill [facility]  Wednesday [address]yn Fill [facility]  Thursday Clinic- Second Floor [address]oney [address]      October 2020      'Sunday Monday Tuesday Wednesday Thursday Friday Saturday                       1     2     3       4     5     6  CV ABDOMINAL US  9:00 [facility]    CV ANKLE BRACHIAL INDEX  9:30 [facility]    Dr. Stoner 10:00 [facility],Division of Vascular Surgery 7    Blood Test    NP 8:30    Cycle 1 Carboplatin / Taxol 11:30 8     9     10       11     12     13  Blood Work    NP  2:30 pm    IVF - 4pm   14    15     16     17       18     19     20   21     22     23    MR THORACIC SPINE  4:00 [facility] [address] 24       25     26     27  Blood Work    NP 9:00    Carboplatin / Taxol Cycle 2 10:00 (Diet 10:00) 28     29     30     31                 November 2020      Sunday Monday Tuesday Wednesday Thursday Friday Saturday   1     2     3     4     5     6     7       8     9     10     11  CT Scan 9:50    12     13     14       15     16     17     18  Blood Work    Dr. Patel 9:00    Carbo/Taxol Cycle 3 @ 10:00 19     20     21'$   $'22     23     24     25     26    'K$ HOLIDAY 27    HOLIDAY '28       29     30                                           '$ December 2020      'Sunday Monday Tuesday Wednesday Thursday Friday Saturday             1     2     3     4     5       6     7     8  Blood Work     NP 9:30     Carbo/Taxol Cycle 4 @ 10:30 9     10     11     12       13     14     15     16     17     18     19       20     21     22     23     24     25  HOLIDAY   26       27     28     29     30     20 October 2019      Sunday Monday Tuesday Wednesday Thursday Friday Saturday                            1  HOLIDAY   2       3     4     5     6     7     8     9       10     11     12     13     14     15     16       17     18     19     20     21     22     23       24     25     26     27     28     29     30       31'$                                                February 2021      'Sunday Monday Tuesday Wednesday Thursday Friday Saturday'$         '1     2     3     4     '$ 5  $'6       7     8     9     10     11     12     13       14     15     16     17     18     19     20       21     22     23     24     25     26     27       28                                             'q$ Have weekly blood work done prior to your doctor appointment every visit     You will receive anti-emetics (anti-nausea medications) to help with side effects.     Ondansetron (Zofran): '8mg'$  tablets. Take 1 tablet every 8 hours as needed for nausea.     Prochlorperazine (Compazine) : '10mg'$  tablet. Take '10mg'$  every 6 hour as necessary for nausea. This can be taken with ondansetron if necessary.     Possible side effects of chemotherapy:    Hair Loss or hair thinning   Lack of appetite, taste change    Nausea/vomiting    Mouth sores    Numbness/tingling    Low blood counts    Low Red blood cells - can cause anemia    Low White blood cells (neutropenia)  - can cause infection    Low Platelets - risk for increased bleeding    Constipation/diarrhea    Fatigue    Pain    Difficulty concentrating    Reproductive/sexuality  changes   Others (see drug specific handout)     How to take care of yourself:   Practice good hand washing and personal hygiene   Avoid people that are ill   Exercise, yet take rest periods as needed   Use a soft toothbrush   Avoid activities that may cause injury or bleeding   Increase non-caffeinated fluid intake to 2-3 quarts per day   Eat a well-balanced diet, which may include a multiple vitamin   Apply lip balm to keep lips moist   Rinse your mouth with salt water solution after all meals and before bedtime   Use condoms for sexual activity for 48 hours after chemotherapy   Do NOT take any over the counter medications, herbal supplements, or have dental procedures without first discussing with your oncology team      Managing Constipation      1. Keep a record of your bowel movements    2. Increase oral fluids (minimum of 2 Liters/Quarts per day)    3. Exercise, such as walking or other aerobic exercise.    4. Eat a high Fiber diet (20-35 grams of fiber per day)   Raw vegetables, fresh or dried fruits, whole grains, nuts, powdered fiber supplements, prune juice    5.    Bowel Medication Regimen:    Begin taking bowel medications:  with pain medication, with anti-nausea medicine, and when you begin chemo.   Start with Colace (docusate) - stool softener - 1 tab a day   Senokot (senna) - natural laxative -  1 tab a day    If you do not have a bowel movement every day or every other day, take one tab of Colace and one tab of Senokot each morning and each night.    You can take up to two Colace and two Senokot each morning and each night.  If you still do not have regular bowel movements, please call the [facility] or tell your doctor.     Decrease the  dose of each if you develop frequent/loose stools   Adjust so that you have a soft BM each day    6. For intermittent worsening of constipation take:  Miralax (polyethylene glycol) 1 heaping teaspoon in 8 ounces of water/juice per day (follow directions on  the bottle)      7.     Call the office if you do not have BM in 2 days.       Many people cope well while receiving chemotherapy treatments. But, most people have some anxiety, concerns, worries, or nervousness. Please discuss anything that is concerning with your oncology team.       When to call the MD:   Call your oncology team with questions about your care or any non-emergency medical problem including:   A temperature over 100.4 F (38 C) (It is advised to purchase a digital thermometer)  Pain, redness, or swelling in the area where you received your shot or IV   Nausea, vomiting, or no appetite for several days   Sores or white spots in your mouth   Constipation or diarrhea for more than one day   Bruising or have small red spots under your skin   Any unusual bleeding   Feeling very sad for several days   Feeling like your heart is beating very fast   Frequent and/or painful urination   A cough that is new, or that doesnt go away   Unusual swelling, pain or warmth in your arm or leg   Feel light headed or faint   New or uncontrolled pain     Seek care immediately (call 911 or have someone bring you to the Emergency Department) for any medical emergency, including:   Chest pain, shortness of breath, or trouble breathing   A severe headache that does not go away, feeling confused, or have new trouble seeing   Bleeding that does not stop or slow down after several minutes     [facility] phone # [phone].     During business hours your call will be returned by a nurse. After business hours and on the weekend your call will be answered by an on-call physician.

## 2019-07-02 NOTE — H&P (Addendum)
General H&P for Inpatients    Chief Complaint: Uncontrolled Pain     History of Present Illness:  HPI     Richard Weaver is 73 y/o male with PMH newly diagnosed likely stage III lung cancer (on carbo/taxol last dose 10/7), current smoker, polio, HTN, COPD, CAD, HFrEF, GERD, CKD3, AAA, left iliac thrombosis who presents to [facility] from infusion center due to uncontrolled cancer pain and fatigue. Hx obtained from pt and significant other, Richard Weaver.     Over the past 2 weeks pt has had little to no appetite and reports a 10 Ib weight loss. He denies dysphagia or odynophagia, and reports food "just doesn't taste good." He has tried taking ensure, but has not been able to tolerate due to taste. Per Richard Weaver, whom pt lives with, he has also become more unsteady on his feet and has become more fatigued with overall, generalized weakness.     His pain is almost always rated as a 5-10, with the worst pain in his left shoulder and back. Admits to associated numbness in this area. Pt follows Dr. Macky Lower with palliative care outpatient for cancer pain in the left back and shoulder. At his appointment on 10/5 he was started on Morphine ER '75mg'$  ER 1 12hr and oxy PO '15mg'$  q6 PRN. Dose was then later decreased to '60mg'$  q12hrs due to confusion. He was seen by rad onc (Dr. Norm Parcel) on 9/24 for left sided chest pain and plans were made for radiation once pt completes chemotherapy. He currently is only taking his Morphine ER as oxycodone does not work and makes him feel like "I'm on [place]."     At infusion center today he only got "about 200 of fluids" per report before being transferred to [place]. He was given IV Morphine with good effect. Currently his pain is well controlled.     Denies No fevers, chills cough, chest pain, abdominal pain, n/v, headaches.     Past Medical History:   Diagnosis Date    AAA (abdominal aortic aneurysm)     CHF (congestive heart failure)     Chronic kidney disease     COPD (chronic obstructive pulmonary disease)      GERD (gastroesophageal reflux disease)     Hypertension      No past surgical history on file.  Family History   Problem Relation Age of Onset    Cancer Mother         esophageal    Cancer Sister         lung    Heart attack Father      Social History     Socioeconomic History    Marital status: Divorced     Spouse name: Not on file    Number of children: Not on file    Years of education: Not on file    Highest education level: Not on file   Tobacco Use    Smoking status: Current Every Day Smoker     Packs/day: 1.50     Years: 50.00     Pack years: 75.00     Types: Cigarettes     Start date: 09/19/1968    Smokeless tobacco: Never Used    Tobacco comment: Currently down to 4-5 per day.    Substance and Sexual Activity    Alcohol use: Not Currently     Frequency: Never    Drug use: Never    Sexual activity: Not on file   Other Topics Concern  Not on file   Social History Narrative    Not on file       Allergies: No Known Allergies (drug, envir, food or latex)    Medications Prior to Admission   Medication Sig    Chemotherapy & Other Meds Exist in Treatment Plan PACLitaxel (Taxol) & CARBOplatin (Paraplatin)    oxyCODONE (ROXICODONE) 5 MG immediate release tablet Take 2 tablets (10 mg total) by mouth every 4 hours as needed for Pain Max daily dose: 12 tablets (Patient taking differently: Take 15 mg by mouth every 4 hours as needed for Pain )    ondansetron (ZOFRAN) 8 MG tablet Take 1 tablet (8 mg total) by mouth 3 times daily as needed Begin 72 hours after treatment if needed for nausea. (Patient not taking: Reported on 06/26/2019)    prochlorperazine (COMPAZINE) 10 MG tablet Take 1 tablet (10 mg total) by mouth every 6 hours as needed (nausea) (Patient not taking: Reported on 06/26/2019)    LORazepam (ATIVAN) 0.5 MG tablet Take 1 tablet (0.5 mg total) by mouth every 6 hours as needed for Anxiety (scans) Max daily dose: 2 mg (Patient not taking: Reported on 06/26/2019)    aspirin 81 MG EC tablet Take  81 mg by mouth daily    atorvastatin (LIPITOR) 80 MG tablet Take 80 mg by mouth daily    carvedilol (COREG) 3.125 MG tablet Take 3.125 mg by mouth 2 times daily (with meals)     esomeprazole (NEXIUM) 40 MG capsule Take 40 mg by mouth daily (before breakfast)    lisinopril (PRINIVIL,ZESTRIL) 30 MG tablet Take 30 mg by mouth daily    budesonide-formoterol (SYMBICORT) 80-4.5 MCG/ACT inhaler Inhale 2 puffs into the lungs 2 times daily Shake well before each use.    Lidocaine 4 % patch Place 1 patch onto the skin every 24 hours Remove & discard patch within 12 hours or as directed.    morphine (MS CONTIN) 30 MG 12 hr tablet Take 1 tablet (30 mg total) by mouth 2 times daily Max daily dose: 60 mg (Patient taking differently: Take 75 mg by mouth 2 times daily )    senna (SENOKOT) 8.6 MG tablet Take 2 tablets by mouth daily (Patient not taking: Reported on 05/31/2019)      Current Facility-Administered Medications   Medication Dose Route Frequency    sodium chloride 0.9 % FLUSH REQUIRED IF PATIENT HAS IV  0-500 mL/hr Intravenous PRN    dextrose 5 % FLUSH REQUIRED IF PATIENT HAS IV  0-500 mL/hr Intravenous PRN    acetaminophen (TYLENOL) tablet 650 mg  650 mg Oral Q6H PRN    calcium carbonate (TUMS) chewable tablet 1,000 mg  1,000 mg Oral Q8H PRN    melatonin tablet 3 mg  3 mg Oral QHS PRN       Review of Systems:   Review of Systems   Constitutional: Positive for malaise/fatigue and weight loss. Negative for chills and fever.   HENT: Negative for congestion, sinus pain and sore throat.    Eyes: Negative.    Respiratory: Negative for cough and shortness of breath.    Cardiovascular: Negative for chest pain, palpitations and leg swelling.   Gastrointestinal: Positive for diarrhea. Negative for abdominal pain, nausea and vomiting.   Genitourinary: Negative for dysuria and urgency.   Musculoskeletal: Positive for back pain.   Neurological: Positive for sensory change and weakness. Negative for dizziness and  headaches.   Psychiatric/Behavioral: Negative for depression.  Last Nursing documented pain:        Patient Vitals for the past 24 hrs:   BP Temp Temp src Pulse Resp SpO2   07/02/19 1722 132/74 36.1 C (97 F) TEMPORAL 87 16 94 %            Physical Exam   Constitutional: He is oriented to person, place, and time. No distress.   Frail, cachectic   HENT:   Head: Normocephalic and atraumatic.   Eyes: Pupils are equal, round, and reactive to light.   Neck: Normal range of motion.   Cardiovascular: Normal rate and regular rhythm.   Pulmonary/Chest: Effort normal and breath sounds normal. No respiratory distress.   Abdominal: Soft. Bowel sounds are normal. He exhibits no distension. There is no abdominal tenderness.   Musculoskeletal: Normal range of motion.         General: Tenderness (Mild tenderness to palpation over the anterior and posterior shoulder.) present. No edema.   Neurological: He is alert and oriented to person, place, and time.   Skin: Skin is warm and dry.   Psychiatric: He has a normal mood and affect. His behavior is normal.       Lab Results: All labs in the last 24 hours: No results found for this or any previous visit (from the past 24 hour(s)).    Radiology Impressions (last 3 days):  No results found.    Currently Active/Followed Hospital Problems:  Active Hospital Problems    Diagnosis    Uncontrolled pain       Assessment: Richard Weaver is 73 y/o male with PMH newly diagnosed, likely stage III lung cancer (on carbo/taxol last dose 10/7), current smoker, HTN, COPD, CAD, HFrEF, GERD, CKD3, AAA, left iliac thrombosis who presents to [facility] from infusion center due to uncontrolled pain and fatigue in the setting of recently diagnosed lung cancer.     Plan:     Uncontrolled Left Shoulder Pain 2/2 Mets:   -Pt requesting to forego imaging tonight, consider x-rays of left shoulder in AM  -Follows Dr. Macky Lower in [facility]- Will need palliative care consult inpatient as pain remains uncontrolled on home  regimen  -Consider rad onc consult for palliative radiation   -Will continue home Morphine ER for now. As oxycodone has not been effective, will trial Dilaudid IV q3 hrs PRN overnight for breakthrough pain     FTT:   -Likely due to cancer and recent chemotherapy, no signs/sxs to suggest infection  -Will give gentle IVF overnight in the setting of poor PO   -Consider appetite stimulant   -Nutrition consult  -PT     Stage III Lung Cancer:   -Followed by Dr. Posey Pronto outpatient    CKD 3:  -Cr=1.41 today (baseline ~1.6)  -Daily BMP    HFrEF/CAD/HTN/HLD:   -Continue home ASA and statin  -Carvedilol and Lisinopril recently d/c'd as BP's have been stable    Chronic:   -COPD: Continue Symbicort  -GERD: Protonix in place of home Esomeprazole   -Bowel regimen: Was prescirbed Senna, however this gave him cramps. Will order Miralax PRN- Last BM yesterday (have been loose s/p chemo).    DVT PPx: Renally dosed Lovenox   Med Rec: Up to date  Code Status: DNR/DNI- MOLST in chart to be signed by attending    Significant other, Richard Weaver, is HCP and would like to be updated daily.     This admission was not billed  Author: Jacqualine Mau, PA  Note created: 07/02/2019  at: 6:14 PM

## 2019-07-02 NOTE — Progress Notes (Signed)
Writer gave report to Mickel Baas with [facility]  -pt came in for provider visit, admitting for pain control + FTT  -non symptomatic covid swab done in clinic  -Mickel Baas verbalized understanding.

## 2019-07-03 ENCOUNTER — Ambulatory Visit: Payer: Medicare (Managed Care) | Admitting: Radiation Oncology

## 2019-07-03 ENCOUNTER — Ambulatory Visit: Payer: Medicare (Managed Care) | Admitting: Primary Care

## 2019-07-03 ENCOUNTER — Inpatient Hospital Stay: Payer: Medicare (Managed Care)

## 2019-07-03 DIAGNOSIS — R54 Age-related physical debility: Secondary | ICD-10-CM

## 2019-07-03 DIAGNOSIS — R0600 Dyspnea, unspecified: Secondary | ICD-10-CM

## 2019-07-03 DIAGNOSIS — K5900 Constipation, unspecified: Secondary | ICD-10-CM

## 2019-07-03 DIAGNOSIS — C3490 Malignant neoplasm of unspecified part of unspecified bronchus or lung: Secondary | ICD-10-CM

## 2019-07-03 DIAGNOSIS — C3491 Malignant neoplasm of unspecified part of right bronchus or lung: Secondary | ICD-10-CM

## 2019-07-03 DIAGNOSIS — J449 Chronic obstructive pulmonary disease, unspecified: Secondary | ICD-10-CM

## 2019-07-03 DIAGNOSIS — R530 Neoplastic (malignant) related fatigue: Secondary | ICD-10-CM

## 2019-07-03 DIAGNOSIS — R0902 Hypoxemia: Secondary | ICD-10-CM

## 2019-07-03 DIAGNOSIS — R627 Adult failure to thrive: Secondary | ICD-10-CM

## 2019-07-03 DIAGNOSIS — E43 Unspecified severe protein-calorie malnutrition: Secondary | ICD-10-CM

## 2019-07-03 DIAGNOSIS — Z515 Encounter for palliative care: Secondary | ICD-10-CM

## 2019-07-03 DIAGNOSIS — J189 Pneumonia, unspecified organism: Secondary | ICD-10-CM

## 2019-07-03 DIAGNOSIS — R52 Pain, unspecified: Secondary | ICD-10-CM

## 2019-07-03 DIAGNOSIS — G893 Neoplasm related pain (acute) (chronic): Principal | ICD-10-CM

## 2019-07-03 LAB — CBC AND DIFFERENTIAL
Baso # K/uL: 0 10*3/uL (ref 0.0–0.1)
Basophil %: 0 %
Eos # K/uL: 0.1 10*3/uL (ref 0.0–0.5)
Eosinophil %: 6 %
Hematocrit: 31 % — ABNORMAL LOW (ref 40–51)
Hemoglobin: 10 g/dL — ABNORMAL LOW (ref 13.7–17.5)
Lymph # K/uL: 0.7 10*3/uL — ABNORMAL LOW (ref 1.3–3.6)
Lymphocyte %: 36 %
MCH: 27 pg (ref 26–32)
MCHC: 33 g/dL (ref 32–37)
MCV: 84 fL (ref 79–92)
Mono # K/uL: 0 10*3/uL — ABNORMAL LOW (ref 0.3–0.8)
Monocyte %: 1 %
Neut # K/uL: 0.9 10*3/uL — ABNORMAL LOW (ref 1.8–5.4)
Nucl RBC # K/uL: 0 10*3/uL (ref 0.0–0.0)
Nucl RBC %: 0 /100 WBC (ref 0.0–0.2)
Platelets: 96 10*3/uL — ABNORMAL LOW (ref 150–330)
RBC: 3.7 MIL/uL — ABNORMAL LOW (ref 4.6–6.1)
RDW: 14.9 % — ABNORMAL HIGH (ref 11.6–14.4)
Seg Neut %: 53 %
WBC: 1.7 10*3/uL — ABNORMAL LOW (ref 4.2–9.1)

## 2019-07-03 LAB — COMPREHENSIVE METABOLIC PANEL
ALT: 11 U/L (ref 0–50)
AST: 18 U/L (ref 0–50)
Albumin: 2.9 g/dL — ABNORMAL LOW (ref 3.5–5.2)
Alk Phos: 86 U/L (ref 40–130)
Anion Gap: 13 (ref 7–16)
Bilirubin,Total: 0.3 mg/dL (ref 0.0–1.2)
CO2: 26 mmol/L (ref 20–28)
Calcium: 8.9 mg/dL (ref 8.6–10.2)
Chloride: 97 mmol/L (ref 96–108)
Creatinine: 1.19 mg/dL — ABNORMAL HIGH (ref 0.67–1.17)
GFR,Black: 70 *
GFR,Caucasian: 60 *
Glucose: 94 mg/dL (ref 60–99)
Lab: 38 mg/dL — ABNORMAL HIGH (ref 6–20)
Potassium: 4.2 mmol/L (ref 3.3–5.1)
Sodium: 136 mmol/L (ref 133–145)
Total Protein: 5.8 g/dL — ABNORMAL LOW (ref 6.3–7.7)

## 2019-07-03 LAB — GRAM STAIN

## 2019-07-03 LAB — COVID-19 PCR

## 2019-07-03 LAB — LEGIONELLA ANTIGEN, URINE: Legionella Antigen (Urine): 0

## 2019-07-03 LAB — DIFF MANUAL
Bands %: 2 % (ref 0–10)
Diff Based On: 100 CELLS
React Lymph %: 2 % (ref 0.0–6.0)

## 2019-07-03 LAB — MAGNESIUM: Magnesium: 2.1 mg/dL (ref 1.6–2.5)

## 2019-07-03 LAB — PROCALCITONIN: Procalcitonin: 0.28 ng/mL — ABNORMAL HIGH (ref 0.00–0.08)

## 2019-07-03 LAB — PHOSPHORUS: Phosphorus: 2.9 mg/dL (ref 2.7–4.5)

## 2019-07-03 LAB — AEROBIC CULTURE

## 2019-07-03 LAB — COVID-19 NAAT (PCR): COVID-19 NAAT (PCR): NEGATIVE

## 2019-07-03 LAB — S. PNEUMONIAE ANTIGEN: S. pneumoniae Antigen: 0

## 2019-07-03 MED ORDER — BUDESONIDE 0.5 MG/2ML IN SUSP *I*
0.5000 mg | Freq: Two times a day (BID) | RESPIRATORY_TRACT | Status: DC
Start: 2019-07-03 — End: 2019-07-05
  Administered 2019-07-03 – 2019-07-05 (×4): 0.5 mg via RESPIRATORY_TRACT
  Filled 2019-07-03 (×4): qty 2

## 2019-07-03 MED ORDER — HYDROMORPHONE PCA 1 MG/ML *WRAPPED*
INTRAMUSCULAR | Status: DC
Start: 2019-07-03 — End: 2019-07-11
  Filled 2019-07-03 (×11): qty 25

## 2019-07-03 MED ORDER — PIPERACILLIN/TAZOBACTAM 3.375 G IN NS MINI-BAG PLUS 110 ML *I*
3.3750 g | Freq: Three times a day (TID) | INTRAVENOUS | Status: AC
Start: 2019-07-03 — End: 2019-07-11
  Administered 2019-07-03 – 2019-07-09 (×19): 3.375 g via INTRAVENOUS
  Filled 2019-07-03 (×19): qty 110

## 2019-07-03 MED ORDER — IPRATROPIUM-ALBUTEROL 0.5-2.5 MG/3ML IN SOLN *I*
3.0000 mL | Freq: Four times a day (QID) | RESPIRATORY_TRACT | Status: DC
Start: 2019-07-03 — End: 2019-07-05
  Administered 2019-07-03 – 2019-07-05 (×7): 3 mL via RESPIRATORY_TRACT
  Filled 2019-07-03 (×8): qty 3

## 2019-07-03 MED ORDER — PIPERACILLIN/TAZOBACTAM 4.5 G IN NS MINI-BAG PLUS 110 ML *I*
4.5000 g | Freq: Once | INTRAVENOUS | Status: AC
Start: 2019-07-03 — End: 2019-07-03
  Administered 2019-07-03: 4.5 g via INTRAVENOUS
  Filled 2019-07-03: qty 110

## 2019-07-03 NOTE — Progress Notes (Signed)
Physical Therapy      PT Adult Assessment - 07/03/19 1500        PT Tracking    PT TRACKING  PT Eval Attempted        Treatment Day    Additional Comments  attempted to see pt for PT evaluation however, pt is currently with another provider at bedside. Will continue to follow as schedule allows        Verneda Skill, DPT Pager # [phone]

## 2019-07-03 NOTE — Progress Notes (Signed)
Assessment of Nutritional Status    Registered Dietitian Section    The following criteria for the diagnosis of malnutrition have been recommended in a consensus statement from the Academy of Nutrition and Dietetics (Academy) and the [employer] of Parenteral and Enteral Nutrition (ASPEN):    Regarding nutritional factors:    1. Insufficient energy intake present: Yes  2. Weight loss (significant weight loss: >2% in 1 month, >5% in 3 months, >10% in 6 months) present: Yes  3. Loss of muscle mass present: Yes  4. Loss of subcutaneous fat present: Yes  5. Localized or generalized fluid accumulation that may mask weight loss present: N/A  6. Diminished functional status as measured by handgrip strength present: N/A     Weight loss history: Pt reports ongoing wt loss over the past 6 months-1 year. He reports loss from 135 lbs (UBW) to current wt 97 lbs = 28% weight loss over the past year    Registered Dietitian Assessment:    Based on the above criteria the patient has  Severe protein calorie malnutrition. It was present on admission.    Physician Section    Physician Malnutrition Statement  Based on the above criteria the patient has Severe protein calorie malnutrition.      Clinical Impact  Refer to registered dietitian plan of care note, dated 07/03/19  Plan: Optimize nutrition per RD recommendation. Will monitor and treat when medically indicated.

## 2019-07-03 NOTE — Plan of Care (Signed)
Patient & SO in agreement with referral to [facility].  SW left VM for Mack Hook, CHN.  Horatio Pel, LMSW  [phone]  07/03/2019  1:48 PM

## 2019-07-03 NOTE — H&P (Signed)
Inpatient Attending Admission Note     Chief Complaint: pain    HPI: Patient is a 73 y.o.male with PMHx as below, including recently diagnosed stage III lung cancer on chemotherapy, last received on 10/7, active smoker, who presents to [facility] on 07/02/2019 with failure to thrive and uncontrolled pain.    Patient currently lives in [place], [place] and is independent of ADLs.  He went to the infusion center on 10/13 due to uncontrolled pain and was sent here to [facility] for admission and treatment.  He describes the pain as sharp, rates it 4-10, localized in his left shoulder and back, associated with numbness, not completely alleviated with his current pain regimen which includes a morphine 60 mg every 12 and oxycodone as needed.  He tells me that the pain medications just make him "loopy" and he does not get any pain relief.  He also reports a poor appetite and poor p.o. intake with resultant weight loss of about 10 pounds in the last week.  Patient was admitted overnight and was seen this morning.  Overnight, patient had episodes of hypoxia and sputum production, chest x-ray reveals possible postobstructive pneumonia?  Patient is currently on supplemental oxygen.  he currently reports better pain control.      Review of systems are positive for generalized fatigue, poor appetite, weight loss, decreased exercise tolerance, constipation.  He denies fevers, chills, vomiting and myalgias.    Past Medical History:   Diagnosis Date    AAA (abdominal aortic aneurysm)     CHF (congestive heart failure)     Chronic kidney disease     COPD (chronic obstructive pulmonary disease)     GERD (gastroesophageal reflux disease)     Hypertension      History reviewed. No pertinent surgical history.  Allergies:   No Known Allergies (drug, envir, food or latex)  Prior to Admission medications    Medication Sig Start Date End Date Taking? Authorizing Provider   Chemotherapy & Other Meds Exist in Treatment Plan  PACLitaxel (Taxol) & CARBOplatin (Paraplatin) 06/26/19  Yes Lovie Chol, MD   oxyCODONE (ROXICODONE) 5 MG immediate release tablet Take 2 tablets (10 mg total) by mouth every 4 hours as needed for Pain Max daily dose: 12 tablets  Patient taking differently: Take 15 mg by mouth every 4 hours as needed for Pain  06/19/19  Yes Rossignolo, Karmen Stabs, FNP   ondansetron (ZOFRAN) 8 MG tablet Take 1 tablet (8 mg total) by mouth 3 times daily as needed Begin 72 hours after treatment if needed for nausea. 06/11/19  Yes Lovie Chol, MD   prochlorperazine (COMPAZINE) 10 MG tablet Take 1 tablet (10 mg total) by mouth every 6 hours as needed (nausea) 06/11/19  Yes Lovie Chol, MD   LORazepam (ATIVAN) 0.5 MG tablet Take 1 tablet (0.5 mg total) by mouth every 6 hours as needed for Anxiety (scans) Max daily dose: 2 mg 05/29/19  Yes Lovie Chol, MD   aspirin 81 MG EC tablet Take 81 mg by mouth daily   Yes [provider]   atorvastatin (LIPITOR) 80 MG tablet Take 80 mg by mouth daily   Yes [provider]   esomeprazole (NEXIUM) 40 MG capsule Take 40 mg by mouth daily (before breakfast)   Yes [provider]   budesonide-formoterol (SYMBICORT) 80-4.5 MCG/ACT inhaler Inhale 2 puffs into the lungs 2 times daily Shake well before each use.   Yes [provider]   Lidocaine 4 % patch  Place 1 patch onto the skin every 24 hours Remove & discard patch within 12 hours or as directed.   Yes [provider]   morphine (MS CONTIN) 30 MG 12 hr tablet Take 1 tablet (30 mg total) by mouth 2 times daily Max daily dose: 60 mg  Patient taking differently: Take 60 mg by mouth 2 times daily  05/15/19  Yes Lovie Chol, MD     Current Facility-Administered Medications   Medication Dose Route Frequency    piperacillin-tazobactam (ZOSYN) IVPB 3.375 g  3.375 g Intravenous Q8H    ipratropium-albuterol (DUONEB) 0.5-2.44m /320mnebulization solution 3 mL  3 mL Nebulization 4x Daily    HYDROmorphone (DILAUDID) PCA  1 mg/mL   Intravenous Continuous    sodium chloride 0.9 % FLUSH REQUIRED IF PATIENT HAS IV  0-500 mL/hr Intravenous PRN    dextrose 5 % FLUSH REQUIRED IF PATIENT HAS IV  0-500 mL/hr Intravenous PRN    acetaminophen (TYLENOL) tablet 650 mg  650 mg Oral Q6H PRN    calcium carbonate (TUMS) chewable tablet 1,000 mg  1,000 mg Oral Q8H PRN    melatonin tablet 3 mg  3 mg Oral QHS PRN    aspirin EC tablet 81 mg  81 mg Oral Daily    atorvastatin (LIPITOR) tablet 80 mg  80 mg Oral Daily    budesonide-formoterol (SYMBICORT) 80-4.5 MCG/ACT inhaler 2 puff  2 puff Inhalation BID    pantoprazole (PROTONIX) EC tablet 40 mg  40 mg Oral QAM    lidocaine (LIDODERM) 5 % patch 1 patch  1 patch Transdermal Daily    ondansetron (ZOFRAN) tablet 8 mg  8 mg Oral TID PRN    polyethylene glycol (GLYCOLAX,MIRALAX) powder 17 g  17 g Oral Daily PRN    enoxaparin (LOVENOX) injection 30 mg  30 mg Subcutaneous Daily @ 2100    HYDROmorphone (DILAUDID) injection 0.5 mg  0.5 mg Intravenous Q3H PRN     Family History   Problem Relation Age of Onset    Cancer Mother         esophageal    Cancer Sister         lung    Heart attack Father      Social History     Socioeconomic History    Marital status: Divorced     Spouse name: Not on file    Number of children: Not on file    Years of education: Not on file    Highest education level: Not on file   Tobacco Use    Smoking status: Current Every Day Smoker     Packs/day: 1.50     Years: 50.00     Pack years: 75.00     Types: Cigarettes     Start date: 09/19/1968    Smokeless tobacco: Never Used    Tobacco comment: Currently down to 4-5 per day.    Substance and Sexual Activity    Alcohol use: Not Currently     Frequency: Never    Drug use: Never    Sexual activity: Not on file   Other Topics Concern    Not on file   Social History Narrative    Not on file       Review of Systems:  All other systems were reviewed and as per HPI or negative    Physical Examination:  Patient Vitals  for the past 24 hrs:   BP Temp Temp src Pulse Resp SpO2 Height Weight  07/03/19 1127 134/74 36.8 C (98.2 F) TEMPORAL 89 20 94 % -- --   07/03/19 0900 -- -- -- -- 24 91 % -- --   07/03/19 0841 -- -- -- 101 (!) 26 (!) 89 % -- --   07/03/19 0833 -- -- -- -- (!) 30 (!) 84 % -- --   07/03/19 0829 130/72 36.5 C (97.7 F) TEMPORAL -- (!) 30 (!) 80 % -- --   07/03/19 0419 -- -- -- -- -- 94 % -- --   07/03/19 0417 -- -- -- -- -- 93 % -- --   07/03/19 0415 -- -- -- -- -- (!) 86 % -- --   07/03/19 0407 139/80 36.9 C (98.4 F) TEMPORAL -- 16 -- -- --   07/03/19 0405 -- -- -- -- -- (!) 72 % -- --   07/03/19 0040 113/54 36 C (96.8 F) TEMPORAL 89 16 92 % -- --   07/03/19 0015 -- -- -- -- -- -- 1.626 m (_0 ) (!) 44 kg (97 lb)   07/02/19 2212 115/67 35.7 C (96.3 F) TEMPORAL 77 15 96 % -- --   07/02/19 1722 132/74 36.1 C (97 F) TEMPORAL 87 16 94 % -- --     Gen:   Comfortable, thin male in bed, appears frail    ENMT: moist mucous membranes, no oropharyngeal lesions, hearing intact   Resp:  good inspiratory effort, chest clear to auscultation bilaterally   CV:    regular rhythm, NO edema, NO murmur   GI:  Bowel sounds present, abd. soft, nondistended, nontender   Msk: no joint tenderness or inflammation, full range of motion in all extremities    Skin: no erythema, dry, intact    Neuro:  speech intact, no focal motor or sensory deficits   Psych:  Alert, oriented to self, date, and place; normal mood, normal affect      Lab Results:   Recent Results (from the past 48 hour(s))   COVID-19 PCR    Collection Time: 07/02/19  1:24 PM   Result Value    COVID-19 Source Nasopharyngeal    COVID-19 PCR NEG   Comprehensive metabolic panel    Collection Time: 07/03/19  4:17 AM   Result Value    Sodium 136    Potassium 4.2    Chloride 97    CO2 26    Anion Gap 13    UN 38 (H)    Creatinine 1.19 (H)    GFR,Caucasian 60    GFR,Black 70    Glucose 94    Calcium 8.9    Total Protein 5.8 (L)    Albumin 2.9 (L)    Bilirubin,Total 0.3    AST  18    ALT 11    Alk Phos 86   CBC and differential    Collection Time: 07/03/19  4:17 AM   Result Value    WBC 1.7 (L)    RBC 3.7 (L)    Hemoglobin 10.0 (L)    Hematocrit 31 (L)    MCV 84    MCH 27    MCHC 33    RDW 14.9 (H)    Platelets 96 (L)    Seg Neut % 53.0    Lymphocyte % 36.0    Monocyte % 1.0    Eosinophil % 6.0    Basophil % 0.0    Neut # K/uL 0.9 (L)    Lymph # K/uL 0.7 (L)    Mono #  K/uL 0.0 (L)    Eos # K/uL 0.1    Baso # K/uL 0.0    Nucl RBC % 0.0    Nucl RBC # K/uL 0.0   Magnesium    Collection Time: 07/03/19  4:17 AM   Result Value    Magnesium 2.1   Phosphorus    Collection Time: 07/03/19  4:17 AM   Result Value    Phosphorus 2.9   Diff manual    Collection Time: 07/03/19  4:17 AM   Result Value    Bands % 2    React Lymph % 2.0    Manual DIFF RESULTS    Diff Based On 100     Radiology:   Chest Single Frontal View    Result Date: 07/03/2019  Worsening airspace opacity in the left upper lobe. This likely relates to the patient's neoplasm plus or minus a postobstructive process which could relate to pneumonia or atelectasis. END OF IMPRESSION [facility] submits this DICOM format image data and final report to the [facility], an independent secure electronic health information exchange, on a reciprocally searchable basis (with patient authorization) for a minimum of 12 months after exam date.    Active diagnoses for this hospitalization include:    Active Hospital Problems    Diagnosis    Uncontrolled pain      Resolved Hospital Problems   No resolved problems to display.       Assessment and Plan:    The patient is a 73 y.o.male who presents with recently diagnosed stage III lung cancer, active smoker and COPD who presents with uncontrolled pain in the setting of possible tumor infiltration of T3 and T4 vertebrae.  Overnight, patient did become hypoxic and chest x-ray reveals possible postobstructive pneumonia.    Uncontrolled pain in the setting of tumor infiltration  Hold home pain  regimen  Palliative/pain control consulted today    Postobstructive pneumonia  Start Zosyn  Send sputum culture  Monitor fevers    Pancytopenia  Likely secondary to chemotherapy  Transfusions not indicated  Continue to monitor CBC daily  Anticipate neutropenia given recent chemotherapy administration    COPD, acute exacerbation in the setting of pneumonia  Patient with increasing oxygen requirement  Scheduled DuoNeb treatments  Discontinue Symbicort and start budesonide nebulizer treatments     Stage III lung cancer  Patient with tumor infiltration of T3 and T4, radiation oncology consulted by primary oncology  We will await final recommendations from radiation oncology.    CAD, chronic and stable  Continue with aspirin and atorvastatin    Estimated Length of Stay greater than 2 midnights    DVT prophylaxis with: LMWH      Pt was interviewed and examined on 07/03/2019     Note created 07/03/2019 1:18 PM

## 2019-07-03 NOTE — Progress Notes (Signed)
Brief Palliative Care NP follow up note:     Returned to pts room to check on comfort after starting PCA. He appears to be resting comfortably in bed and reports improvement in pain control.     Reviewed use of PCA with S.O. [name]. Per Quentin Angst was initially confused on how to use use pain pump. During my visit, he was able to demonstrate proper use of pain button.      Will follow up tomorrow and make adjustments to plan as needed.     Sherrie Mustache, NP  3:13 PM

## 2019-07-03 NOTE — H&P (Deleted)
Radiation Oncology Inpatient Consult Note 07/03/2019      Patient ID: [name] is a 73 y.o. male with a diagnosis of stage 3 NSCLC currently on chemotherapy who presented to the hospital with FTT and uncontrolled pain.    Diagnosis (including stage):   Cancer Staging  No matching staging information was found for the patient.    Oncologic History:  Richard Weaver's oncologic history was obtained through chart review and was discussed in summary with him.     The patients lung cancer is invading into T3/T4 vertebrae and he is experiencing extensive pain in his L scapula radiating around to his nipple. He has seen radiation already for potential treatment after chemotherapy, but at this point the pain requires constant pain medications that make him very drowsy and him and his wife are not happy with his quality of life.   Radiation was called to talk to them about palliative radiation to improve the pain.  He has been losing weight, is down to 100 pounds. He developped diarrhea after his first cycle of chemo but it has since resolved. Since getting to the hospital he has had some hypoxia and is not on NC O2, he is on PCA which is doing a much better job controlling his pain and keeping him alert. He also has been having phlegm/sputum with coughing. He was started on empiric abx for PNA  He says that he has not coughed up any blood, is not having any urinary or bowel symptoms, is not having any muscle weakness. His L arm movement is limited by pain.    He says that he will have trouble laying flat due to pain, but would like to attempt to have SIM and treatment if it means his pain could be better controlled.     Implantable cardiac device? denies  Autoimmune disease? denies  Collagen vascular disease? denies  T2DM? denies   Metformin? denies   Insulin pump? denies  Prior radiation therapy? Denies  Ability to comfortably lay flat? Not for long  Good range of motion in the arms? L arm limited by  pain  Claustrophobia? denies  Able to follow commands? yes  Possibility of pregnancy? No  Additional considerations for treatment/planning? Pain control prior to coming to rad onc      Past Medical History:   Diagnosis Date    AAA (abdominal aortic aneurysm)     CHF (congestive heart failure)     Chronic kidney disease     COPD (chronic obstructive pulmonary disease)     GERD (gastroesophageal reflux disease)     Hypertension        Social History     Socioeconomic History    Marital status: Divorced     Spouse name: Not on file    Number of children: Not on file    Years of education: Not on file    Highest education level: Not on file   Tobacco Use    Smoking status: Current Every Day Smoker     Packs/day: 1.50     Years: 50.00     Pack years: 75.00     Types: Cigarettes     Start date: 09/19/1968    Smokeless tobacco: Never Used    Tobacco comment: Currently down to 4-5 per day.    Substance and Sexual Activity    Alcohol use: Not Currently     Frequency: Never    Drug use: Never    Sexual activity: Not on  file   Other Topics Concern    Not on file   Social History Narrative    Not on file       Cancer-related family history includes Cancer in his mother and sister.    Review of Systems:  A 12-point review of symptoms was completed and was otherwise negative except as noted in the HPI.    Physical Exam:  There were no vitals taken for this visit.  There were no vitals filed for this visit.    Performance Status: Score-50- Requires considerable assistance and frequent medical care.    General: chronically ill appearing, cachectic, low energy  Skin: Not diaphoretic.  HEENT: PERRL, EOMI, anicteric sclera, MMM, no OP erythema, exudate, or lesions  Neuro: alert and oriented, visual fields grossly intact, no gross cranial nerve deficit, fluent speech, no dysarthria  Lungs: CTAB, LUL without air movement, productive cough with white phlegm  CV: RRR  MSK: moves all extremities spontaneously, LUE ROM  limited to parallel with the floor due to pain  Psych: cooperative, engaged, with linear thought process, appropriate affect    Imaging:    Mr Head Without & With Contrast    Result Date: 06/04/2019  No evidence of intracranial metastasis. Moderate chronic microvascular ischemic changes. Diffuse parenchymal volume loss. END OF IMPRESSION [facility] submits this DICOM format image data and final report to the [facility], an independent secure electronic health information exchange, on a reciprocally searchable basis (with patient authorization) for a minimum of 12 months after exam date.    Chest Single Frontal View    Result Date: 07/03/2019  Worsening airspace opacity in the left upper lobe. This likely relates to the patient's neoplasm plus or minus a postobstructive process which could relate to pneumonia or atelectasis. END OF IMPRESSION [facility] submits this DICOM format image data and final report to the [facility], an independent secure electronic health information exchange, on a reciprocally searchable basis (with patient authorization) for a minimum of 12 months after exam date.       Laboratory Data:  Reviewed.     Assessment:  Richard Weaver is a 73 y.o. male with stage 3 NSCLC s/p 1 cycle of chemotherapy now in the hospital for pain control and FTT. Radiation oncology is seeing him for palliative radiation to the primary mass and vertebrae to help with pain control.    We had an extensive discussion with [facility] regarding his diagnosis and therapeutic options. The process of radiation therapy was carefully reviewed and included discussion of the risks, benefits, and alternatives to therapy, possible acute and late side effects, CT simulation, dosimetry planning, and anticipated follow up. Afterward, he was amenable to pursuing radiotherapy. Eligibility for pursuing radiation therapy is as noted in the HPI. All of the patients questions were answered to his satisfaction. Informed consent was  obtained and he was scheduled to undergo CT simulation tomorrow. It is anticipated that the entire course of treatment will span 1 weeks. We will generate a treatment plan soon afterwards and informed him that if there are any questions or concerns to please contact us.    Plan:  - To rad onc 10/15 for simulation  - Will likely need and benefit from pain medication bolus prior to arrival  - [facility] was informed to contact us with any other requests in the interim.     The patient was also seen and evaluated by Dr. Roosevelt Locks; formal attestation to follow.     Jenny Reichmann  Ileana Roup MD  Radiation Oncology PGY 2

## 2019-07-03 NOTE — Plan of Care (Signed)
Home Health Assessment    Completed by: Wallie Char, RN- [facility] home care coordinator  Phone: [phone]    Referred by: acute care coordinator    Source of Information: medical record      Home Health indicators present: lung cancer with uncontrolled pain/failure to thrive     Barriers to discharge to be addressed: medical/therapy clearance for home     Plan: home care nursing for assessment/teaching at discharge      Comments: medical record reviewed for home care  Will follow with pt re d/c plan/home care needs

## 2019-07-03 NOTE — H&P (Addendum)
Radiation Oncology Inpatient Consult Note 07/03/2019      Patient ID: [name] is a 73 y.o. male with a diagnosis of stage 3 NSCLC currently on chemotherapy who presented to the hospital with FTT and uncontrolled pain.    Diagnosis (including stage):   Cancer Staging  No matching staging information was found for the patient.    Oncologic History:  Richard Weaver's oncologic history was obtained through chart review and was discussed in summary with him.     The patients lung cancer is invading into T3/T4 vertebrae and he is experiencing extensive pain in his L scapula radiating around to his nipple. He has seen radiation already for potential treatment after chemotherapy, but at this point the pain requires constant pain medications that make him very drowsy and him and his wife are not happy with his quality of life.   Radiation was called to talk to them about palliative radiation to improve the pain.  He has been losing weight, is down to 100 pounds. He developped diarrhea after his first cycle of chemo but it has since resolved. Since getting to the hospital he has had some hypoxia and is not on NC O2, he is on PCA which is doing a much better job controlling his pain and keeping him alert. He also has been having phlegm/sputum with coughing. He was started on empiric abx for PNA  He says that he has not coughed up any blood, is not having any urinary or bowel symptoms, is not having any muscle weakness. His L arm movement is limited by pain.    He says that he will have trouble laying flat due to pain, but would like to attempt to have SIM and treatment if it means his pain could be better controlled.     Implantable cardiac device? denies  Autoimmune disease? denies  Collagen vascular disease? denies  T2DM? denies   Metformin? denies   Insulin pump? denies  Prior radiation therapy? Denies  Ability to comfortably lay flat? Not for long  Good range of motion in the arms? L arm limited by  pain  Claustrophobia? denies  Able to follow commands? yes  Possibility of pregnancy? No  Additional considerations for treatment/planning? Pain control prior to coming to rad onc      Past Medical History:   Diagnosis Date    AAA (abdominal aortic aneurysm)     CHF (congestive heart failure)     Chronic kidney disease     COPD (chronic obstructive pulmonary disease)     GERD (gastroesophageal reflux disease)     Hypertension        Social History     Socioeconomic History    Marital status: Divorced     Spouse name: Not on file    Number of children: Not on file    Years of education: Not on file    Highest education level: Not on file   Tobacco Use    Smoking status: Current Every Day Smoker     Packs/day: 1.50     Years: 50.00     Pack years: 75.00     Types: Cigarettes     Start date: 09/19/1968    Smokeless tobacco: Never Used    Tobacco comment: Currently down to 4-5 per day.    Substance and Sexual Activity    Alcohol use: Not Currently     Frequency: Never    Drug use: Never    Sexual activity: Not on  file   Other Topics Concern    Not on file   Social History Narrative    Not on file       Cancer-related family history includes Cancer in his mother and sister.    Review of Systems:  A 12-point review of symptoms was completed and was otherwise negative except as noted in the HPI.    Physical Exam:  There were no vitals taken for this visit.  There were no vitals filed for this visit.    Performance Status: Score-50- Requires considerable assistance and frequent medical care.    General: chronically ill appearing, cachectic, low energy  Skin: Not diaphoretic.  HEENT: PERRL, EOMI, anicteric sclera, MMM, no OP erythema, exudate, or lesions  Neuro: alert and oriented, visual fields grossly intact, no gross cranial nerve deficit, fluent speech, no dysarthria  Lungs: CTAB, LUL without air movement, productive cough with white phlegm  CV: RRR  MSK: moves all extremities spontaneously, LUE ROM  limited to parallel with the floor due to pain  Psych: cooperative, engaged, with linear thought process, appropriate affect    Imaging:    Mr Head Without & With Contrast    Result Date: 06/04/2019  No evidence of intracranial metastasis. Moderate chronic microvascular ischemic changes. Diffuse parenchymal volume loss. END OF IMPRESSION [facility] submits this DICOM format image data and final report to the [facility], an independent secure electronic health information exchange, on a reciprocally searchable basis (with patient authorization) for a minimum of 12 months after exam date.    Chest Single Frontal View    Result Date: 07/03/2019  Worsening airspace opacity in the left upper lobe. This likely relates to the patient's neoplasm plus or minus a postobstructive process which could relate to pneumonia or atelectasis. END OF IMPRESSION [facility] submits this DICOM format image data and final report to the [facility], an independent secure electronic health information exchange, on a reciprocally searchable basis (with patient authorization) for a minimum of 12 months after exam date.       Laboratory Data:      Lab results: 07/03/19  0417   WBC 1.7*   Hemoglobin 10.0*   Hematocrit 31*   RBC 3.7*   Platelets 96*   Seg Neut % 53.0   Lymphocyte % 36.0   Monocyte % 1.0   Eosinophil % 6.0   Basophil % 0.0   Bands % 2         Lab results: 07/03/19  0417   Sodium 136   Potassium 4.2   Chloride 97   CO2 26   UN 38*   Creatinine 1.19*   GFR,Caucasian 60   GFR,Black 70   Glucose 94   Calcium 8.9   Total Protein 5.8*   Albumin 2.9*   ALT 11   AST 18   Alk Phos 86   Bilirubin,Total 0.3       Assessment:  Richard Weaver is a 73 y.o. male with stage 3 NSCLC s/p 1 cycle of chemotherapy now in the hospital for pain control and FTT. Radiation oncology is seeing him for palliative radiation to the primary mass and vertebrae to help with pain control.    We had an extensive discussion with [facility] regarding his  diagnosis and therapeutic options. The process of radiation therapy was carefully reviewed and included discussion of the risks, benefits, and alternatives to therapy, possible acute and late side effects, CT simulation, dosimetry planning, and anticipated follow up. Afterward, he  was amenable to pursuing radiotherapy. Eligibility for pursuing radiation therapy is as noted in the HPI. All of the patients questions were answered to his satisfaction. Informed consent was obtained and he was scheduled to undergo CT simulation tomorrow. It is anticipated that the entire course of treatment will span 1 weeks. We will generate a treatment plan soon afterwards and informed him that if there are any questions or concerns to please contact us.    Plan:  - To rad onc 10/15 for simulation and treatment, 4 Gy x5  - Will likely need and benefit from pain medication bolus prior to arrival  - [facility] was informed to contact us with any other requests in the interim.     The patient was also seen and evaluated by Dr. Roosevelt Locks; formal attestation to follow.     Kathrin Penner MD  Radiation Oncology PGY 2    Attending Attestation: I, Dr. Roosevelt Locks, saw and evaluated the patient on 07/03/2019. I agree with the findings.  I have reviewed and edited resident's note.  I have documented the plan of care as above.

## 2019-07-03 NOTE — Provider Consult (Addendum)
[facility]    History & Physical Exam for Inpatient Consult     LOS: 1 day     Consult Requested by: Dr Roanna Banning  Attending Provider: Dallie Dad, MD     Principle Problem: <principal problem not specified>     Consult Reason: pain/symptoms    Consult Diagnosis: intractable cancer associated pain     Terminal Diagnosis: stage III lung cancer     Primary Care Provider: Margurite Auerbach, MD     Identifying Data: 73 y.o. male patient admitted 07/02/2019 from home where he lives with his partner, Richard Weaver.     History Richard by:  Condition  Additional history obtained from:  Other providers and [facility] records  and summarized with below    History of Present Illness: Richard Weaver is a 73 y/o with PMHx significant for polio, HTN, COPD, CAD, CHF, CKD3, AAA, left iliac thrombosis, tobacco use and newly diagnosed stage III lung cancer currently on carbo/taxol (last dose 10/7, primary oncologist Dr Posey Pronto) who presented to [facility] from infusion center due to intractable cancer associated pain and fatigue. Richard Weaver was seen by Dr Macky Lower with [facility]iative care on 10/5 for an new visit. During this visit, long acting morphine was increased from 60 mg BID to 75 mg BID with Oxy IR 15 mg PO q 6 hours as needed. Morphine dose was decreased back to 60 mg PO BID 10/12 due to concern for increased lethargy. Per admission note, pt also reports 10# weight loss and lack of appetite. His partner is concerned about on-going fatigue, weakness and functional decline. Palliative care has been consulted to assist with symptom management.     Writer saw Richard Weaver this morning. He initially was hesitant to speak with me due to there being "too many doctors involved already" but eventually agrees as Probation officer may be able to help with his pain.  He describes terrible pain in his LEFT scapula which wraps around to his anterior chest wall. He rates his pain 10/10 at its worst and pain is now  6-7/10. He reports some improvement in pain with IV hydromorphone. Pain has not been well managed at home and small increases in long acting morphine have made him feel "worse." He is unsure how much of the prn Oxy IR he was using. Aside from his pain, Richard Weaver also reports shortness of breath with a new supplemental oxygen requirement, poor appetite and recent weight loss. He reports having loose stool after his most recent chemotherapy but now is bothered by constipation with no BM in at least 3-4 days. He reports significant fatigue. At this point, Richard Weaver kindly asked to be left alone.     Home pain regimen:   Morphine ER 60 mg PO BID (since 10/12)  Oxy IR 15 mg PO q 6 hours prn-- unsure of how much he was using     Comfort meds since admission:   Acetaminophen 650 mg PO q 6 hours prn- 1x  Hydromorphone 0.5 mg IV q 3 hours prn- 2x  Lidocaine 5% patch daily   Morphine ER 60 mg PO BID     What bothers you the most? The pain     Past Medical History:   Diagnosis Date    AAA (abdominal aortic aneurysm)     CHF (congestive heart failure)     Chronic kidney disease     COPD (chronic obstructive pulmonary disease)     GERD (gastroesophageal reflux disease)  Hypertension         History reviewed. No pertinent surgical history.    Family History   Problem Relation Age of Onset    Cancer Mother         esophageal    Cancer Sister         lung    Heart attack Father        Social History     Socioeconomic History    Marital status: Divorced     Spouse name: Not on file    Number of children: Not on file    Years of education: Not on file    Highest education level: Not on file   Occupational History    Not on file   Tobacco Use    Smoking status: Current Every Day Smoker     Packs/day: 1.50     Years: 50.00     Pack years: 75.00     Types: Cigarettes     Start date: 09/19/1968    Smokeless tobacco: Never Used    Tobacco comment: Currently down to 4-5 per day.    Substance and Sexual Activity    Alcohol  use: Not Currently     Frequency: Never    Drug use: Never    Sexual activity: Not on file   Social History Narrative    Not on file          Allergies: No Known Allergies (drug, envir, food or latex)     Medications Prior to Admission   Medication Sig    Chemotherapy & Other Meds Exist in Treatment Plan PACLitaxel (Taxol) & CARBOplatin (Paraplatin)    oxyCODONE (ROXICODONE) 5 MG immediate release tablet Take 2 tablets (10 mg total) by mouth every 4 hours as needed for Pain Max daily dose: 12 tablets (Patient taking differently: Take 15 mg by mouth every 4 hours as needed for Pain )    ondansetron (ZOFRAN) 8 MG tablet Take 1 tablet (8 mg total) by mouth 3 times daily as needed Begin 72 hours after treatment if needed for nausea.    prochlorperazine (COMPAZINE) 10 MG tablet Take 1 tablet (10 mg total) by mouth every 6 hours as needed (nausea)    LORazepam (ATIVAN) 0.5 MG tablet Take 1 tablet (0.5 mg total) by mouth every 6 hours as needed for Anxiety (scans) Max daily dose: 2 mg    aspirin 81 MG EC tablet Take 81 mg by mouth daily    atorvastatin (LIPITOR) 80 MG tablet Take 80 mg by mouth daily    esomeprazole (NEXIUM) 40 MG capsule Take 40 mg by mouth daily (before breakfast)    budesonide-formoterol (SYMBICORT) 80-4.5 MCG/ACT inhaler Inhale 2 puffs into the lungs 2 times daily Shake well before each use.    Lidocaine 4 % patch Place 1 patch onto the skin every 24 hours Remove & discard patch within 12 hours or as directed.    morphine (MS CONTIN) 30 MG 12 hr tablet Take 1 tablet (30 mg total) by mouth 2 times daily Max daily dose: 60 mg (Patient taking differently: Take 60 mg by mouth 2 times daily )        Current Facility-Administered Medications   Medication Dose Route Frequency    piperacillin-tazobactam (ZOSYN) IVPB 3.375 g  3.375 g Intravenous Q8H    ipratropium-albuterol (DUONEB) 0.5-2.'5mg'$  /38m nebulization solution 3 mL  3 mL Nebulization 4x Daily    HYDROmorphone (DILAUDID) PCA 1 mg/mL    Intravenous Continuous  sodium chloride 0.9 % FLUSH REQUIRED IF PATIENT HAS IV  0-500 mL/hr Intravenous PRN    dextrose 5 % FLUSH REQUIRED IF PATIENT HAS IV  0-500 mL/hr Intravenous PRN    acetaminophen (TYLENOL) tablet 650 mg  650 mg Oral Q6H PRN    calcium carbonate (TUMS) chewable tablet 1,000 mg  1,000 mg Oral Q8H PRN    melatonin tablet 3 mg  3 mg Oral QHS PRN    aspirin EC tablet 81 mg  81 mg Oral Daily    atorvastatin (LIPITOR) tablet 80 mg  80 mg Oral Daily    budesonide-formoterol (SYMBICORT) 80-4.5 MCG/ACT inhaler 2 puff  2 puff Inhalation BID    pantoprazole (PROTONIX) EC tablet 40 mg  40 mg Oral QAM    lidocaine (LIDODERM) 5 % patch 1 patch  1 patch Transdermal Daily    ondansetron (ZOFRAN) tablet 8 mg  8 mg Oral TID PRN    polyethylene glycol (GLYCOLAX,MIRALAX) powder 17 g  17 g Oral Daily PRN    enoxaparin (LOVENOX) injection 30 mg  30 mg Subcutaneous Daily @ 2100    HYDROmorphone (DILAUDID) injection 0.5 mg  0.5 mg Intravenous Q3H PRN        Palliative Care Review of Systems:  Last Nursing documented pain:  0-10 Scale: 5 (07/03/19 1134)  Revised FLACC Score: 0 (07/03/19 0428)     Pain Screening: Severe  Dyspnea Screening: Mild  Other symptoms:   Nausea   None  Anorexia   Moderate  Anxiety   Mild  Tiredness/Fatigue   Moderate  Constipation   yes  Unable to Respond   no  Delirium   no    Pain Assessment:  See HPI     Palliative Care Psychosocial Needs Screen:  x - Care-giver issues (family dynamics, burnout, social supports)  (if patient screens positive, please refer to social work for Palliative Care Assessment)    Palliative Care Spiritual Needs Screen:  x - No needs/issues  (if patient screens positive, please refer to chaplain for Palliative Care Assessment)    Palliative Performance Scale    % Ambulation Activity Level  Evidence of Disease Self-Care Intake Level of Consciousness Estimated Median Survival in Days         (a) (b) (c)   100 Full Normal   No Disease Full Normal Full -  - -   90 Full Normal  Some Disease Full Normal Full - - 108   80 Full Normal with Effort  Some Disease Full Normal or Reduced Full - - 108   70 Reduced Cant do normal job   or work  Some Disease Full As above Full 145 - 108   60 Reduced Cant do hobbies or housework  Significant Disease Occasional Assistance  Needed As above Full or Confusion 29 4 108   50 Mainly sit/lie Cant do any work  Extensive Disease Considerable Assistance  Needed As above Full or Confusion 30 11 41   40 Mainly  in Bed As above Mainly Assistance As above Full or Drowsy or Confusion 18 8 41   30 Bed Bound As above Total Care Reduced As above 8 5 41   20 Bed Bound As above As above Minimal As above '4 2 6   10 '$ Bed Bound As above As above Mouth Care Only Drowsy or Coma '1 1 6   '$ 0 Death - - - -  -  -  -       (a) Survival post-admission  to an inpatient palliative unit, all diagnoses (Virik 2002).  (b) Days until inpatient death following admission to an acute hospice unit, diagnoses not specified ([place]).  (c) Survival post admission to an inpatient palliative unit, cancer patients only ([place]).     References  1. Adele Barthel, Downing Le Roy, Hill J. Palliative Performance Scale (PPS): a new tool. [place]; 12(1): 5-11.  2. Yehuda Mao, Natale Milch, et al. Validity of the Palliative Performance Scale from a survival perspective. J Pain Symp Manage. 1999; 18(1):2-3.  3. Virik K, Glare P. Validation of the Palliative Performance Scale for inpatients admitted to a palliative care unit in [place], [place]. J Pain Symp Manage. 2002; 23(6):455-7.    ECOG:  ECOG PERFORMANCE STATUS*  Grade ECOG   0 Fully active, able to carry on all pre-disease performance without restriction   1 Restricted in physically strenuous activity but ambulatory and able to carry out work of a light or sedentary nature, e.g., light Rudder work, office work   2 Ambulatory and capable of all selfcare but unable to carry out any work activities. Up and  about more than 50% of waking hours   3 Capable of only Richard selfcare, confined to bed or chair more than 50% of waking hours   4 Completely disabled. Cannot carry on any selfcare. Totally confined to bed or chair   5 Dead   * As published in Am. J. Clin. Oncol.:  Eustace Pen, M.M., Colon Flattery., Wright, D.C., Horton, Sharen Hint., Drexel Iha, P.P.: Toxicity And Response Criteria Of The [facility]. [place] 4:132-440, 1982.         Patient Capacity:  Full    Advance Directives:    DNI: yes    DNR: yes    Health Care Proxy: yes    Living Will: no    Health Care Agent Name/Relationship: Richard Weaver, [place]     Emergency Contact from System:  Extended Emergency Contact Information  Primary Emergency Contact: [place] Phone: [phone]  Relation: Significant other   System Code Status: DNR/DNI  MOLST in chart    Current Therapies:  No ventilator, dialysis, feeding tube, or TPN.    Physical Examination:  Physical exam substantially Richard by patient condition & circumstances.     BP 134/74 (BP Location: Left arm)    Pulse 89    Temp 36.8 C (98.2 F) (Temporal)    Resp 20    Ht 1.626 m ('5\' 4"'$ )    Wt (!) 44 kg (97 lb)    SpO2 94%    BMI 16.65 kg/m     Physical Exam  General: thin, frail appearing male, in bed. Appears comfortable with normal WOB, neck pillow in place   Skin: warm, dry skin   Lungs: supplemental o2 in place, normal work of breathing   Psych: frustrated with repeating his story, minimal engagement     Lab Results:     Recent Labs   Lab 07/03/19  0417   WBC 1.7*   Hemoglobin 10.0*   Hematocrit 31*   Platelets 96*     Other Labs:  Albumin   Date Value Ref Range Status   07/03/2019 2.9 (L) 3.5 - 5.2 g/dL Final            Recent Labs   Lab 07/03/19  0417   Sodium 136   Potassium 4.2   Chloride 97   CO2 26  UN 38*   Creatinine 1.19*   Glucose 94   Calcium 8.9        Radiology: Personally reviewed. Pertinent findings   10/14 CXR: Worsening airspace  opacity in the left upper lobe. This likely relates to the patient's neoplasm plus or minus a postobstructive process which could relate to pneumonia or atelectasis.    Assessment/Plan:   This is a case of intractable cancer associated pain, frailty, neoplastic fatigue, dyspnea, anorexia, severe protein calorie malnutrition, and constipation in a 73 y.o. male with stage III lung cancer.    Symptom Management:   -Cancer associated pain/dyspnea: Pt has intractable pain, but reports some improvement with IV hydromorphone. His home pain regimen ranges from 120-210 mg morphine equivalent (depending on prn Oxy use). Will start pt on hydromorphone PCA 0.5 mg PO q 60 minutes prn, no continuous-- the hope is that a PCA will help guide an appropriate pain regimen for discharge. Per Dr Eldred Manges note, rad onc will see pt for palliative radiation. May need to consider adding dexamethasone to target bony pain-- will defer to primary team and oncology. Could also consider adding APAP 1000 mg PO TID (d/c prn dosing).     Fatigue/frailty: continue supportive cares and assistance with ADLs as needed. In the further, low dose steroids may help with fatigue as well.     Anorexia/FTT/PCM: continue to offer small amounts of desired foods, with companionship for meals, supervision, and careful hand feeding as needed    Constipation: Would suggest adding senna 1-2 tabs PO Q HS and dulcolax 10 mg PR prn daily for constipation.     Prognosis: Did not discuss. Prognosis is often dependent on what, if any, limits are placed on care. Would suspect that given pts frailty and overall decline, even with continued cancer directed treatments, prognosis may be measured in months.     Goals of Care: Improved pain management.     Other Recommendations: Will continue to support Richard Weaver and Richard Weaver.     Family Meeting Scheduled: No    Old records reviewed and summarized with the information above.    Sherrie Mustache, NP on 07/03/2019 at 1:16 PM     My total  patient care time on the unit was more than 80 minutes for this E/M service.  More than 50% of this service was  Counseling and coordination of care.

## 2019-07-03 NOTE — Provider Consult (Signed)
[facility] Oncology Service--New Patient Evaluation    Diagnosis: Locally Advanced Lung Cancer directly invading into T3-T4    CC: Left shoulder blade, back pain.    HPI:  Richard Weaver is a 73 y.o. male who presents to [facility] as direct admit from oncology clinic with uncontrolled pain.  He has been following with Dr. Macky Lower from palliative care and there have been multiple adjustments of his MSContin dosing recently due to fatigue/lethargy, most recently decreased to 60 mg BID.  Oxy prn was making him loopy so he has been avoiding this recently.      Since being at [facility], he has received several doses of prn dilaudid. He reports this has provided significant relief.  He does also have dyspnea and had some mild hypoxia, but this has improved with O2. He reports not wearing O2 at home.    He received 1 dose of carbo/taxol last week. Reports diarrhea after chemo followed by constipation. Last BM was 4 days ago.    Oncologic History  12/2018 - left shoulder blade pain started, declined ED evaluation.   02/16/19 - ED visit for left-sided chest pain. CTA Chest showed irregular posterior pleural base LUL mass (5.4 x 3.3 cm) with post left 4th rib involvement, extension to left superior hilum and left hilar adenopathy.   03/11/19 - Evaluated by [facility] (Dr. Julien Nordmann). Was recommended to have biopsy of LUL mass, with plans for referral to Radiation Oncology. Note stated "highly suspicious stage IIIA (T3, N1, M0)."  03/2019 - ED Melina Fiddler) visit for self-limited hemoptysis. Was treated with course of Augmentin. Declined hospital admission.   06/26/19: C 1 Carbo/Taxol        ROS:  Constitutional: Pos for fatigue. Neg for fever.  HENT: Negative for nosebleeds and trouble swallowing.  Negative for headaches or changes in vision.  Respiratory: See HPI.  Cardiovascular: Negative for palpitations.   Gastrointestinal: See HPI.   Musculoskeletal: See HPI.  Skin: Negative for rash.   GU: negative  for dysuria, frequency, nocturia, or hematuria  Neurological: Negative for focal weakness.  Psychiatric/Behavioral: Negative for confusion. The patient is not nervous/anxious.     PMH:  Past Medical History:   Diagnosis Date    AAA (abdominal aortic aneurysm)     CHF (congestive heart failure)     Chronic kidney disease     COPD (chronic obstructive pulmonary disease)     GERD (gastroesophageal reflux disease)     Hypertension        MEDS:  Scheduled Meds:   piperacillin-tazobactam  4.5 g Intravenous Once    piperacillin-tazobactam  3.375 g Intravenous Q8H    ipratropium-albuterol  3 mL Nebulization 4x Daily    aspirin  81 mg Oral Daily    atorvastatin  80 mg Oral Daily    budesonide-formoterol  2 puff Inhalation BID    pantoprazole  40 mg Oral QAM    lidocaine  1 patch Transdermal Daily    morphine  60 mg Oral BID    enoxaparin  30 mg Subcutaneous Daily @ 2100     Continuous Infusions:  PRN Meds:.sodium chloride, dextrose, acetaminophen, calcium carbonate, melatonin, ondansetron, polyethylene glycol, HYDROmorphone hcl      Allergies:  No Known Allergies (drug, envir, food or latex)    PSH:  History reviewed. No pertinent surgical history.    Family History:  Family History   Problem Relation Age of Onset    Cancer Mother  esophageal    Cancer Sister         lung    Heart attack Father        Social History:  Social History     Socioeconomic History    Marital status: Divorced     Spouse name: Not on file    Number of children: Not on file    Years of education: Not on file    Highest education level: Not on file   Occupational History    Not on file   Tobacco Use    Smoking status: Current Every Day Smoker     Packs/day: 1.50     Years: 50.00     Pack years: 75.00     Types: Cigarettes     Start date: 09/19/1968    Smokeless tobacco: Never Used    Tobacco comment: Currently down to 4-5 per day.    Substance and Sexual Activity    Alcohol use: Not Currently     Frequency: Never     Drug use: Never    Sexual activity: Not on file   Social History Narrative    Not on file       Physical Exam:  BP 130/72 (BP Location: Left arm)    Pulse 101    Temp 36.5 C (97.7 F) (Temporal)    Resp 24    Ht 162.6 cm (_0 )    Wt (!) 44 kg (97 lb)    SpO2 91%    BMI 16.65 kg/m   Gen: NAD, lying sitting up in bed, cachectic.  HENT: No oral lesions, Neck is supple.  Eyes: sclera anicteric, non injected.   Lymph: No cervical, supraclavicular, axillary, or inguinal lymphadenopathy appreciated  Chest: CTA b/L  CV: RRR  Ab: S,NT, ND +BS, no hepatosplenomegaly  EXT: No edema, warm and well-perfused  Neuro:  A and O x3.  Psych: mood is calm and appropriate    Labs:  Recent Results (from the past 72 hour(s))   COVID-19 PCR    Collection Time: 07/02/19  1:24 PM   Result Value Ref Range    COVID-19 Source Nasopharyngeal     COVID-19 PCR NEG NEG   Comprehensive metabolic panel    Collection Time: 07/03/19  4:17 AM   Result Value Ref Range    Sodium 136 133 - 145 mmol/L    Potassium 4.2 3.3 - 5.1 mmol/L    Chloride 97 96 - 108 mmol/L    CO2 26 20 - 28 mmol/L    Anion Gap 13 7 - 16    UN 38 (H) 6 - 20 mg/dL    Creatinine 1.19 (H) 0.67 - 1.17 mg/dL    GFR,Caucasian 60 *    GFR,Black 70 *    Glucose 94 60 - 99 mg/dL    Calcium 8.9 8.6 - 10.2 mg/dL    Total Protein 5.8 (L) 6.3 - 7.7 g/dL    Albumin 2.9 (L) 3.5 - 5.2 g/dL    Bilirubin,Total 0.3 0.0 - 1.2 mg/dL    AST 18 0 - 50 U/L    ALT 11 0 - 50 U/L    Alk Phos 86 40 - 130 U/L   CBC and differential    Collection Time: 07/03/19  4:17 AM   Result Value Ref Range    WBC 1.7 (L) 4.2 - 9.1 THOU/uL    RBC 3.7 (L) 4.6 - 6.1 MIL/uL    Hemoglobin 10.0 (L) 13.7 - 17.5 g/dL  Hematocrit 31 (L) 40 - 51 %    MCV 84 79 - 92 fL    MCH 27 26 - 32 pg    MCHC 33 32 - 37 g/dL    RDW 14.9 (H) 11.6 - 14.4 %    Platelets 96 (L) 150 - 330 THOU/uL    Seg Neut % 53.0 %    Lymphocyte % 36.0 %    Monocyte % 1.0 %    Eosinophil % 6.0 %    Basophil % 0.0 %    Neut # K/uL 0.9 (L) 1.8 - 5.4 THOU/uL     Lymph # K/uL 0.7 (L) 1.3 - 3.6 THOU/uL    Mono # K/uL 0.0 (L) 0.3 - 0.8 THOU/uL    Eos # K/uL 0.1 0.0 - 0.5 THOU/uL    Baso # K/uL 0.0 0.0 - 0.1 THOU/uL    Nucl RBC % 0.0 0.0 - 0.2 /100 WBC    Nucl RBC # K/uL 0.0 0.0 - 0.0 THOU/uL   Magnesium    Collection Time: 07/03/19  4:17 AM   Result Value Ref Range    Magnesium 2.1 1.6 - 2.5 mg/dL   Phosphorus    Collection Time: 07/03/19  4:17 AM   Result Value Ref Range    Phosphorus 2.9 2.7 - 4.5 mg/dL   Diff manual    Collection Time: 07/03/19  4:17 AM   Result Value Ref Range    Bands % 2 0 - 10 %    React Lymph % 2.0 0.0 - 6.0 %    Manual DIFF RESULTS     Diff Based On 100 CELLS       Radiology:  Mr Head Without & With Contrast    Result Date: 06/04/2019  No evidence of intracranial metastasis. Moderate chronic microvascular ischemic changes. Diffuse parenchymal volume loss. END OF IMPRESSION [facility] Imaging submits this DICOM format image data and final report to the [facility], an independent secure electronic health information exchange, on a reciprocally searchable basis (with patient authorization) for a minimum of 12 months after exam date.    Chest Single Frontal View    Result Date: 07/03/2019  Worsening airspace opacity in the left upper lobe. This likely relates to the patient's neoplasm plus or minus a postobstructive process which could relate to pneumonia or atelectasis. END OF IMPRESSION [facility] submits this DICOM format image data and final report to the [facility], an independent secure electronic health information exchange, on a reciprocally searchable basis (with patient authorization) for a minimum of 12 months after exam date.    Assessment:  73 y/o male with history with locally advanced NSCLC with direct invasion into thoracic spine who presents with uncontrolled pain 1 week after beginning carbo/taxol.    His pain is improved with IV dilaudid and he was continued on his home MScontin. We will consult palliative care and consider PCA.    I  discussed with his s/o Richard Weaver at [facility] request about his care. After discussion with his primary oncologist Dr. Posey Pronto, we will recommend he receive palliative radiation.  He was previously seen by Dr. Norm Parcel who discussed several different courses of RT, long course, intermediate or short course palliative RT. Given his acute pain and hospitalization, palliative radiation for pain control could be beneficial in Richard Weaver.  We will consult Rad-Onc  Plan:    Palliative care consult for assistance with pain management   Needs bowel regimen as per primary Hospitalist Team.   Rad-Onc  consult   Richard Weaver is 900. Would hold off on neupogen unless trending down tomorrow, could dose if counts decrease Thursday    We provided time for questions and all were answered to satisfaction.  They were encouraged to call should any questions or concerns arise in the interim between visits.    Richard Weaver F. Morton Peters, MD  Assistant Professor of Medicine  Medical Oncology    07/03/2019

## 2019-07-03 NOTE — Interdisciplinary Rounds (Signed)
Interdisciplinary Discharge Communication Note    Expected Discharge Date: TBD   Discharge to: home with services(from [facility])    Rounding was performed on: Date: 07/03/19 at: Time: 0900    ATTENDANCE  Charge RN, CC, SW, Attending, APP, PT([place] Attending, Palliative Care NP)    Admit Date/Time:  07/02/2019  5:10 PM    Principal Problem:  <principal problem not specified>  The patient's problem list and interdisciplinary care plan was reviewed.    Discharge Planning    Barriers to Discharge  patient medically acute;IV medications;pain management;consults;PT clearance        Geographic Patient: Yes    [place], Choiced?: Yes Patient home care agency: [facility]    Medical Equipment Needs        Equipment/Supplies Ordered: None       Delivery of D/C DME/medical supplies confirmed, if applicable: Not Applicable    Teaching Needs           Current PCP:  Margurite Auerbach, MD      Scheduled Future Appointments:   Future Appointments    Friday July 12, 2019  4:30 PM  (Arrive by 4:00 PM)  MR Thoracic Spine With Contrast with RIS, RR MR2 (RRMR2)  [place] [facility] ([place]) [phone]   Tuesday July 16, 2019  9:00 AM  FOLLOW UP VISIT with Dietrich Pates, [place] (--) [phone]   Tuesday July 16, 2019 10:00 AM  CHEMOTHERAPY with CAN CTR, POD F  [place] (--) [phone]   Tuesday July 16, 2019 10:00 AM  FOLLOW UP VISIT with Waldo Laine, [facility] (--) [place], LMSW  2:36 PM

## 2019-07-03 NOTE — Plan of Care (Addendum)
Problem: Malnutrition  Goal: Nutritional status maintained or improved  Outcome: Progressing towards goal  Intervention: Liberalize Diet  Note: Current diet- low Na+, low K+. K+ and Na+ wnl at present. Will change diet to regular to optimize po intakes.   Intervention: Chief Financial Officer (oral supplement)  Note: Pt accepting of trying Ensure. Will order Ensure Compact BID (220 kcal/9g pro per serving). Pt prefers chocolate   Intervention: Vitamin/mineral supplement  Note: Recommend daily MVI, thiamine   Intervention: Other  Note: Please document % meal intakes per flowsheets    Recommend obtain weekly wt     Labs reviewed    Menu at bedside, pt reports able to call in meal selections prn     Would clarify [place] re: supplemental TF/PEG as pt with severe malnutrition, ongoing wt loss, and poor intakes    Initial Nutrition Assessment    Eval and Treat Consult Received       Last weight   07/03/19 (!) 44 kg (97 lb)         Assessment  Estimated Nutrient Needs: Estimated Caloric Needs  Total Caloric Estimated Needs: 1540 kcal maintenance,   >1760 kcal wt gain  Needs based on: Kcal/kg - specify (Comment)(35 kcal/kg,   40 kcal/kg)  Needs based on (weight): Actual(44 kg, 97 lb)  Estimated Protein Needs  Total Protein Estimated Needs: 66-88 g(1.5-2.0 g/kg)  Needs based on (weight): Actual(44 kg, 97 lb, 5'4")    Malnutrition  Height: 162.6 cm ('5\' 4"'$ )  Height Method: Stated  Weight: (!) 44 kg (97 lb)  Weight Method: Actual  Type of Scale: Bed scale  BMI (Calculated): 16.7  Weight Loss: Pt reports ongoing wt loss over the past 6 months-1 year. He reports loss from 135 lbs (UBW) to current wt 97 lbs = 28% weight loss over the past year  Energy Intake: Pt reports poor appetite for "months" that became much worse over the past two weeks. 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 M with PMH newly diagnosed, likely stage III lung cancer (on carbo/taxol last dose 10/7), current smoker, HTN, COPD, CAD, HFrEF, GERD, CKD3, AAA, left iliac thrombosis who presented to [facility] from infusion center due to uncontrolled pain and fatigue in the setting of recently diagnosed lung cancer. FTT. Pt laying in bed at time of visit, not in good spirits today. He reports ongoing poor po intake and wt loss as above. Displays physical signs of malnutrition. Was drinking gatorade a little at home but food just didn't taste good to him. Reports UBW 135-140 lbs. Skin appears intact.     Action Taken   As above        Further Recommendations   As above    Shelda Altes, [place]  Clinical Nutrition  Pager [phone], Phone [phone]

## 2019-07-03 NOTE — Progress Notes (Signed)
07/03/19 1700   Clinical Encounter Type   Visited With Patient   Visit Type Referral   Encounter Type Spiritual support   Referral From Nurse   Referral To Chaplain   Patient Spiritual Encounters   Identified Religion Unknown/no preferences   Patient Coping Open discussion   Care Provided   Chaplain Interventions Reflective listening;Shared play   Time Calculation   Start Time 1705   Stop Time 1708   Time Calculation 3     PALLIATIVE CARE ASSESSMENT    SPIRITUAL HISTORY:  Patient's chart did not indicate a particular religious faith.  Our short conversation did not offer any clues either.    SPIRITUAL NEEDS ASSESSMENT:  Assessment could not be performed In the short visit made.  Patient indicated that he was tired and ready for a nap.    PLAN OF CARE:  Spiritual Care intends to re-visit Patient.

## 2019-07-03 NOTE — Consults (Signed)
Palliative Care Social Work Psychosocial Assessment     Contacts/Support System:   [facility] Name: Paula Libra  [facility] Phone Number: [phone]       Living Situation (check all that apply):  Lives with family Chief Operating Officer and two cats)     Advance Directives (check all that apply):   DNR/DNI      Caregiver Issues:   None noted     Legal Concerns:  Ex-wife still included in patient's will, encouraged to seek legal assistance with updating     Insurance/Financial Concerns: None noted     Discharge Planning: Home with services     Plan for SW follow up: As needed for support and discharge planning. D/W SO accommodations availability however she for the time being will make the trips because of their cats at home.    Horatio Pel, [place]  07/03/2019  2:02 PM

## 2019-07-03 NOTE — Progress Notes (Signed)
Patient instructed in use of MDI and spacer. Patient is able to verbalize and demonstrate appropriate skill.

## 2019-07-03 NOTE — Progress Notes (Signed)
07/03/19 1232   [facility] Patient Class Review   Patient Class Review Inpatient     Patient Class effective 07/02/2019  5:10 PM    Forest Becker  Utilization Management RN  [email or web address]  [phone] / Pager [phone]  [phone]

## 2019-07-03 NOTE — Progress Notes (Addendum)
Writer notified that patient had become SOB and desated to 72% on room air at 0405. Rhea Pink, RN, placed pt on 6L NC. Writer entered room and O2 sat was at 86% on 6L NC at 0415. Patient up to 93% on 6L NC by 0417. Patient weaned down to 2L NC - sating at 94%. Pleural rub heard on L side. R sided lung diminished. Patient has strong, moist cough - producing moderate amounts of thick, green sputum. Patient reporting feeling SOB when the oxygen was first placed, but no SOB currently. Covering, [employer], notified.    Continuous fluids discontinued. CXR ordered and completed 0500.

## 2019-07-04 ENCOUNTER — Encounter: Payer: Medicare (Managed Care) | Admitting: Radiation Oncology

## 2019-07-04 DIAGNOSIS — C3412 Malignant neoplasm of upper lobe, left bronchus or lung: Secondary | ICD-10-CM

## 2019-07-04 DIAGNOSIS — C7951 Secondary malignant neoplasm of bone: Secondary | ICD-10-CM

## 2019-07-04 LAB — RBC MORPHOLOGY

## 2019-07-04 LAB — CBC AND DIFFERENTIAL
Baso # K/uL: 0 10*3/uL (ref 0.0–0.1)
Basophil %: 0 %
Eos # K/uL: 0 10*3/uL (ref 0.0–0.5)
Eosinophil %: 4.3 %
Hematocrit: 27 % — ABNORMAL LOW (ref 40–51)
Hemoglobin: 8.9 g/dL — ABNORMAL LOW (ref 13.7–17.5)
Lymph # K/uL: 0.4 10*3/uL — ABNORMAL LOW (ref 1.3–3.6)
Lymphocyte %: 52.9 %
MCH: 28 pg (ref 26–32)
MCHC: 33 g/dL (ref 32–37)
MCV: 82 fL (ref 79–92)
Mono # K/uL: 0.1 10*3/uL — ABNORMAL LOW (ref 0.3–0.8)
Monocyte %: 14.3 %
Neut # K/uL: 0.2 10*3/uL — ABNORMAL LOW (ref 1.8–5.4)
Nucl RBC # K/uL: 0 10*3/uL (ref 0.0–0.0)
Nucl RBC %: 0 /100 WBC (ref 0.0–0.2)
Platelets: 65 10*3/uL — ABNORMAL LOW (ref 150–330)
RBC: 3.2 MIL/uL — ABNORMAL LOW (ref 4.6–6.1)
RDW: 15.3 % — ABNORMAL HIGH (ref 11.6–14.4)
Seg Neut %: 25.7 %
WBC: 0.7 10*3/uL — ABNORMAL LOW (ref 4.2–9.1)

## 2019-07-04 LAB — DIFF MANUAL
Bands %: 1 % (ref 0–10)
Diff Based On: 70 CELLS
React Lymph %: 1.4 % (ref 0.0–6.0)

## 2019-07-04 LAB — COMPREHENSIVE METABOLIC PANEL
ALT: 10 U/L (ref 0–50)
AST: 16 U/L (ref 0–50)
Albumin: 2.7 g/dL — ABNORMAL LOW (ref 3.5–5.2)
Alk Phos: 78 U/L (ref 40–130)
Anion Gap: 13 (ref 7–16)
Bilirubin,Total: 0.3 mg/dL (ref 0.0–1.2)
CO2: 27 mmol/L (ref 20–28)
Calcium: 8.6 mg/dL (ref 8.6–10.2)
Chloride: 94 mmol/L — ABNORMAL LOW (ref 96–108)
Creatinine: 1.23 mg/dL — ABNORMAL HIGH (ref 0.67–1.17)
GFR,Black: 67 *
GFR,Caucasian: 58 * — AB
Glucose: 127 mg/dL — ABNORMAL HIGH (ref 60–99)
Lab: 30 mg/dL — ABNORMAL HIGH (ref 6–20)
Potassium: 4.1 mmol/L (ref 3.3–5.1)
Sodium: 134 mmol/L (ref 133–145)
Total Protein: 5.4 g/dL — ABNORMAL LOW (ref 6.3–7.7)

## 2019-07-04 MED ORDER — SENNOSIDES 8.6 MG PO TABS *I*
2.0000 | ORAL_TABLET | Freq: Every evening | ORAL | Status: DC
Start: 2019-07-04 — End: 2019-07-06
  Administered 2019-07-04 – 2019-07-05 (×2): 2 via ORAL
  Filled 2019-07-04 (×2): qty 2

## 2019-07-04 MED ORDER — LORAZEPAM 0.5 MG PO TABS *I*
0.5000 mg | ORAL_TABLET | Freq: Four times a day (QID) | ORAL | Status: DC | PRN
Start: 2019-07-04 — End: 2019-07-08
  Administered 2019-07-06: 0.5 mg via ORAL
  Filled 2019-07-04: qty 1

## 2019-07-04 MED ORDER — BISACODYL 10 MG RE SUPP *I*
10.0000 mg | Freq: Every day | RECTAL | Status: DC | PRN
Start: 2019-07-04 — End: 2019-07-11

## 2019-07-04 MED ORDER — FILGRASTIM-SNDZ (ZARXIO) 300 MCG/0.5ML IJ SOSY *I*
300.0000 ug | PREFILLED_SYRINGE | INTRAMUSCULAR | Status: AC
Start: 2019-07-04 — End: 2019-07-06
  Administered 2019-07-04 – 2019-07-06 (×3): 300 ug via SUBCUTANEOUS
  Filled 2019-07-04 (×3): qty 0.5

## 2019-07-04 NOTE — Progress Notes (Signed)
Patient premedicated for radiation per Dr. Roosevelt Locks note. Given 0.'5mg'$  dilaudid per eMAR. Will continue to monitor.    Barbaraann Share, RN

## 2019-07-04 NOTE — Progress Notes (Deleted)
Radiation Oncology CT-Simulation note    Patient identification: Richard Weaver is a 73 y.o. male with history of Stage IIIc (cT4,cN1,cM0) with CT scan demonstrating a mass in the LUL that measures 43.45m x 63.847m with concomitant PET avidity with SUV max 14 which was eroding the T3, T4  vertebrae and posterior ribs 3-5, this is causing him significant pain symptoms.        The patient is here for palliative radiation treatment simulation    Description of procedure:     RoKerri Ascheas placed on the CT simulator table in the supine position using vac bag for immobilization. The left lung lesion was centered in the field of view within the simulator aperture. Multiple axial images were obtained from the entire region of interest and transmitted to the treatment planning system.     We used the treatment planning system to display axial, coronal and sagittal representations of the area of interest; we will use 3D  technique (pending conformation). We determined and estimated isocenter of these ports. We then tattooed the patient's skin to indicate the position of the three-point set-up, or per the experienced therapists recommendations. The patient tolerated the simulation without any complications.     Treatment will start today.

## 2019-07-04 NOTE — Progress Notes (Signed)
PALLIATIVE CARE ASSESSMENT    PLAN OF CARE:   Pt stated that he was not interested in receiving spiritual care.  Pt was alert and his wife was with him.     Murphy Oil, M.Teacher, early years/pre of [place]

## 2019-07-04 NOTE — Interdisciplinary Rounds (Signed)
Interdisciplinary Discharge Communication Note    Discharge date: TBD   Discharge to: home with services    Rounding was performed on: Date: 07/03/19 at: Time: 0900    ATTENDANCE  APP, SW, CC, Charge RN, PT, Attending, Other (comment)(palliative care PA/NP and [place] attending)    Admit Date/Time:  07/02/2019  5:10 PM    Principal Problem:  <principal problem not specified>  The patient's problem list and interdisciplinary care plan was reviewed.    Discharge Planning    Barriers to Discharge  patient medically acute;pain management;PT clearance        Geographic Patient: Yes    [place], Choiced?: Yes Patient [facility] agency[facility]    Medical Equipment Needs  Current Home Equipment: None   Medical Supplies Available: Not Applicable Equipment/Supplies Ordered: None       Delivery of D/C DME/medical supplies confirmed, if applicable: Not Applicable    Teaching Needs  Teaching Needs: N/A        Current PCP:  Margurite Auerbach, MD      Scheduled Future Appointments:   Future Appointments    Friday July 12, 2019  4:30 PM  (Arrive by 4:00 PM)  MR Thoracic Spine With Contrast with RIS, RR MR2 (RRMR2)  [facility] ([place]) [phone]   Tuesday July 16, 2019  9:00 AM  FOLLOW UP VISIT with Dietrich Pates, [place] (--) [phone]   Tuesday July 16, 2019 10:00 AM  CHEMOTHERAPY with CAN CTR, POD F  [place] (--) [phone]   Tuesday July 16, 2019 10:00 AM  FOLLOW UP VISIT with Waldo Laine, [facility] (--) [phone]          PLAN  Transportation                Williams Che, RN  9:44 AM

## 2019-07-04 NOTE — Progress Notes (Signed)
Hospital Medicine Daily Progress Note                                              LOS: 2 day    Significant 24-hour Events:   No significant events    Subjective:   Patient is doing well on the PCA pump and reports adequate pain control.  He was seen by radiation oncology yesterday and the plan is to start radiation treatments today.  His significant other, [name], is at the bedside.  They both spoke with the palliative care team and the plan is to transition to hospice on discharge.  Patient is insistent on wanting to go home today.  I explained that he is unable to be discharged with a PCA running unless he is finally enrolled in hospice.  He is very concerned about his hospital bill if he stays in the hospital to receive the radiation treatments.  He expressed feeling overwhelmed by different providers and receiving a lot of information in a few days.  He wants time to digest information and make a well-informed decision about discharge planning.  In the interim, he has agreed to remain inpatient to complete his radiation treatments.     Objective:     Vitals:    07/04/19 0731   BP: 106/50   Pulse: 77   Resp: 16   Temp: 37.3 C (99.1 F)   Weight:    Height:      BP: (106-140)/(50-75)   Temp:  [36.9 C (98.4 F)-37.3 C (99.1 F)]   Temp src: Temporal (10/15 0731)  Heart Rate:  [70-87]   Resp:  [16-18]   SpO2:  [93 %-98 %]      Intake/Output Summary (Last 24 hours) at 07/04/2019 1154  Last data filed at 07/04/2019 0731  Gross per 24 hour   Intake 764.18 ml   Output 1350 ml   Net -585.82 ml     Physical Exam:  Constitutional: NAD, thin, cachetic, frail.   HENT: normocephalic, atraumatic  Mouth/Throat: MMM; clear oropharynx  Neck: no JVD  CV: RRR, normal S1/S2; no M/R/G  Pulmonary: normal WOB; CTA bilaterally; no wheezes, rhonchi, or crackles  Abdominal: BS normal; soft, non TTP, non-distended    Recent Lab, Micro, and Imaging Studies   Basic Metabolic Panel CBC   Recent Labs     07/04/19  0526 07/03/19  0417    Sodium 134 136   Potassium 4.1 4.2   Chloride 94* 97   CO2 27 26   UN 30* 38*   Creatinine 1.23* 1.19*   Glucose 127* 94   Calcium 8.6 8.9   Magnesium  --  2.1     No components found with this basename: PHOS      Recent Labs     07/04/19  0526 07/03/19  0417   WBC 0.7* 1.7*   Hemoglobin 8.9* 10.0*   Hematocrit 27* 31*   Platelets 65* 96*      Liver Panel Other Labs   Recent Labs     07/04/19  0526 07/03/19  0417   AST 16 18   ALT 10 11   Alk Phos 78 86   Total Protein 5.4* 5.8*   Albumin 2.7* 2.9*   Bilirubin,Total 0.3 0.3     No results for input(s): INR, CRP, ESR, TROP, CK, MCKMB, BNP in the last  72 hours.    No components found with this basename: PT, APT, TROPONINI, TROPONINT, CKTOTAL, CKMBINDEX  No results for input(s): CHOL, HDL, LDL, TRIG in the last 72 hours.  No results for input(s): APH, APCO2, APO2, AHCO3, ABE in the last 168 hours.  No results for input(s): VPH, VPCO2, VPO2, VHCO3, VBE in the last 168 hours.     Micro results   Aerobic Culture   Date Value Ref Range Status   07/03/2019 Lab Cancel  Final          Radiology results   Chest Single Frontal View    Result Date: 07/03/2019  Worsening airspace opacity in the left upper lobe. This likely relates to the patient's neoplasm plus or minus a postobstructive process which could relate to pneumonia or atelectasis. END OF IMPRESSION [facility] submits this DICOM format image data and final report to the [facility], an independent secure electronic health information exchange, on a reciprocally searchable basis (with patient authorization) for a minimum of 12 months after exam date.       Assessment:   Richard Weaver is a 73 y.o. male with history of stage III lung cancer who presented with failure to thrive and uncontrolled pain, currently being treated for postobstructive pneumonia.    Stage III lung cancer, Non small cell lung cancer   Status post 1 cycle of chemotherapy  Plan to transition to hospice and discontinue chemotherapy  treatments    Pancytopenia secondary to chemotherapy   transfusion of blood products not indicated today  Start on Neupogen day 1 of 3  Start neutropenic precautions    Postobstructive pneumonia, present on admission  Sputum cultures are growing both gram-positive cocci, gram-negative bacilli and yeast  Will await final speciation and sensitivities  Continue with Zosyn    Acute on chronic COPD exacerbation  Continue with scheduled duo nebs and budesonide nebulizer treatments  Supplemental oxygen as needed  Goal O2 sats is 88 to 92%.    Cancer related pain , T3-T4 infiltration  Palliative care involved  Continue with PCA  Palliative radiation treatments to begin today, day 1 of 5    Plan:     DVT Prophylaxis: Lovenox   PUD Prophylaxis: Protonix  CODE STATUS:DNR/DNI    Disposition: Home with hospice    Dallie Dad, MD on 07/04/2019 at 11:54 AM

## 2019-07-04 NOTE — Progress Notes (Addendum)
Per Palliative Care NP, pt and partner would like to pursue home hospice plan in [facility], would like to go home as soon as possible, will need PCA for home. SW [facility]ed referral to [facility] and left VM for [facility] nurse [name] [phone] to give her pt's referral. SW remains available as needed.    Vira Browns, LMSW Pager [phone]  07/04/2019 11:11 AM

## 2019-07-04 NOTE — Progress Notes (Signed)
[place] Referral    [address], [place] [address]  [phone]  FAX: [phone]      To:  [facility]     Date: 07/04/2019    Fax #:  [phone]    From:  Vira Browns, LMSW      Contact #: [phone]    Subject:  Hospice Referral    Prognosis:  Weeks to less than one month    Diagnosis: metastatic lung cancer    Referring Physician: Dr. Tawanna Cooler    ATTACH FACE SHEET FROM PATIENT HOSPITAL RECORD    Confidentiality Notice  This transmission contains confidential information protected by [employer] law and HIPAA regulations. You are prohibited from making any further disclosure of this information without the specific written consent of the person to whom it pertains, or as otherwise permitted by law. A general authorization for the release of medical or other information is not sufficient authorization for further disclosure of information, which is protected by [place] Article 27-F or Title 42 of the Code of [employer]. Any unauthorized further disclosure in violation of State law may result in a fine or jail sentence or both. If you have received this material in error, please notify the sender IMMEDIATELY to arrange for the return or destruction of the document(s).

## 2019-07-04 NOTE — Progress Notes (Deleted)
Patient received Radiation Therapy 1 of 5 scheduled treatments.

## 2019-07-04 NOTE — Progress Notes (Signed)
Physical Therapy    Initial Eval Completed.  Patient is a 73 y.o.male who presents with uncontrolled pain   Discharge recommendation:  24 Hour supervision/assist, Anticipate return to prior living arrangement  Equipment recommendations upon discharge: Hospital bed  Mobility recommendations for nursing while in hospital: SBA     Past Medical History:   Diagnosis Date    AAA (abdominal aortic aneurysm)     CHF (congestive heart failure)     Chronic kidney disease     COPD (chronic obstructive pulmonary disease)     GERD (gastroesophageal reflux disease)     Hypertension        History reviewed. No pertinent surgical history.    *Bold Indicates co-morbidities affecting treatment and recovery    Comorbidities affecting treatment/recovery in addition to those listed above:  Cancer (chemotherapy/radiation therapy)    Personal factors affecting treatment/recovery:   none identified    Clinical presentation:   stable       Patient complexity:  low level as indicated by above personal factors, environmental factors and comorbidities in addition to their impairments found on physical exam.    PT Adult Assessment - 07/04/19 1358        Prior Living     Prior Living Situation  Reported by patient     Lives With  Significant other    s.o is there near 24/7    Type of Home  Mobile home     # Steps to [place]  3    with rail     # Of Steps In [place] in Home  None        Prior Function Level    Prior Function Level  Reported by patient     Transfers  Independent     Transfer Devices  None     Walking  Independent     Walking assistive devices used  None     Stair negotiation  Independent        PT Tracking    PT TRACKING  PT Discontinue     Type of Session  evaluation     SW Request?  No        Treatment Day    Treatment Day  0        Precautions/Observations    Precautions used  Yes     LDA Observation  IV lines;O2 (comment);PCA     Fall Precautions  Bed alarm engaged;General falls precautions         Pain Assessment    Pain Intervention(s)  Repositioned;Refer to nursing for pain management     Additional comments  pt reports 7/10 pain in upper back "where it always is."         Cognition    Cognition  Tested     Arousal/Alertness  Appropriate responses to stimuli     Orientation  Alert and oriented x4     Following Commands  Follows simple commands without difficulty        UE Assessment    UE Assessment  Full range RUE AROM;Full range LUE AROM    mildly limited B shoulder flexion given upper back pain        LE Assessment    LE Assessment  Full AROM RLE;Full AROM LLE        Sensation    Sensation  --    reports B toes are intermittently "numb" at baseline  Bed Mobility    Bed mobility  Tested     Supine to Sit  Modified independent (device)     Sit to Supine  Modified independent (device)        Transfers    Transfers  Tested     Sit to Stand  Stand by assistance     Stand to sit  Stand by assistance     Transfer Assistive Device  none        Mobility    Mobility  Tested     Gait Pattern  Decreased cadence;Decreased R step length;Decreased R step height;Decreased L step length;Decreased L step height    mildly unsteady     Ambulation Assist  Stand by;Contact guard     Ambulation Distance (Feet)  140 feet     Ambulation Assistive Device  None     Additional comments  pt declines AD. Pt demonstrate functional ROM and strength to be able to complete the stairs to enter his home with assist as needed         Family/Caregiver Training`    Patient/Family/Caregiver training  Yes     Patient training  Role of physical therapy in hospital and plan for evaluation and follow up;Discharge planning    discussed ambulation/activity to pt tolerance        Additional Comments    Additional comments  pt reports at home he had great difficulty getting comfortable in his bed and spent alot of his time "up and down." in the middle of the night. Pt would benefit from hospital bed for independent repositioning ability given  significant pain related to bony lesions. Pt declines RW, states his sister has one he could borrow as needed in the future. S/O encouraged to provide SBA for ambulation which she agrees         Assessment    Brief Assessment  Patient demonstrates adequate mobility skills to return home        Plan/Recommendation    Treatment Interventions  No further PT interventions    pt is transitioning home with hospice    PT Frequency  none further     Mobility Recommendations  SBA      Discharge Recommendations  24 Hour supervision/assist;Anticipate return to prior living arrangement     PT Discharge [place] [facility] bed     Assessment/Recommendations Reviewed With:  Family;Nursing;Patient;Social Worker     Next PT Visit  n/a        Time Calculation    PT Untimed Codes  18     PT Total Treatment  18        Plan and Onset date    Plan of Care Date  07/04/19     Onset Date  07/03/19     Treatment Start Date  07/04/19       Verneda Skill, DPT Pager # [phone]

## 2019-07-04 NOTE — Progress Notes (Signed)
Palliative Care Inpatient Progress Note     LOS: 2 days     Subjective:   Chart reviewed; events noted. No overnight events. Richard Weaver was seen by Richard Weaver yesterday, will start treatment today.     Richard Weaver reports great improvement in pain control with PCA. He is eager to have RXT today and then go home.     Richard Weaver was also at bedside so Richard Weaver took this opportunity to further discuss Richard Weaver. Richard Weaver tells Richard Weaver that "chemo almost killed me." We discussed that at a certain point in disease progression, chemotherapy can be more harmful than beneficial. I voiced my worry that because of his functional status and overall poor health, he would not be able to safely receive any further chemotherapy. Richard Weaver was almost glad to hear this and replied that he wouldn't want any more chemo. Richard Weaver agreed. We then spent time discussing what Richard Weaver would want. He tells me that he would like to be home with Richard Weaver and be comfortable. He want to finish his radiation therapy and "be done with it." He tells Richard Weaver that he knows he is dying and he isn't afraid. Writer presented recommendation of engaging hospice team to assist [place] with a home based comfort care plan. Hospice philosophy and insurance benefit was reviewed. Richard Weaver told Richard Weaver that he recently had an uncle who passed away on hospice-- he thinks that the hospice team was "really helpful and supportive." Richard Weaver was extremely grateful for this conversation and supports Richard Weaver's desire to focus on comfort and quality of life for whatever time he has left.     Comfort meds/24 hours:   Hydromorphone PCA 0.5 mg q 60 minutes--5.5 mg total/14 demands/11 deliveries  Hydromorphone 0.5 mg IV q 3 hours prn- 3x    History limited by patient condition/circumstance.      Palliative Care Review of Systems:  Pain   Mild  Nausea   None  Anorexia   Severe  Tiredness/Fatigue   Moderate  Constipation   no  Unable to Respond   no      Physical Exam:  Exam is limited but as noted.    General Exam: cachetic, frail appearing  male, in bed, appears comfortable, nad   Mental State: mood and affect better today- more pleasant and engaging with Richard Weaver.     Assessment/Plan:  This is a case of intractable cancer associated pain, frailty, neoplastic fatigue, dyspnea, anorexia, severe protein calorie malnutrition, and constipation in a 73 y.o. male with stage III lung cancer. After further conversation today with Richard Weaver and his S.O Richard Weaver, decision to go forward with palliative XRT while inpatient but transition to comfort measures and a hospice referral. Richard Weaver would like to be discharged ASAP but he and Richard Weaver recognize that coming back and forth to the hospital for XRT may be burdensome.     Symptom Management:   Pain/dyspnea: Richard Weaver required hydromorphone 0.5 mg IV x 3 additional doses on top of PCA. Will shorten lock out to 30 minutes to account for additional dosing. Plan would be to go home with PCA for hospice.     Anorexia/FTT/PCM: continue to offer small amounts of desired foods, with companionship for meals, supervision, and careful hand feeding as needed    Anxiety: could consider adding prn lorazepam 0.5 mg PO/SL q 6 hours prn for anxiety or distress.     Constipation: Would suggest adding senna 1-2 tabs PO Q HS and dulcolax 10 mg PR prn daily for constipation.     Other  Recommendations: Recommend asking [facility] to meet with Richard Weaver and Richard Weaver while he is still at [facility] as setting up an out of county hospice plan can take time.     Our [place] conversation today was a "heavy one"-- Richard Weaver acknowledged how difficult these conversations can be. Will clarify tomorrow whether or not Richard Weaver would like to start full CMO while inpatient-- did not due this today as it seemed that we had spent enough time on a really tough conversation.     DNR/DNI     Care coordination: [place] team (Drs Roanna Banning and Renelda Loma PA), [facility] care team, nursing     D/C Plan: likely home with hospice next week (after completion of XRT)    My total patient care time on the  unit >35'; >50% counseling and coordination of care.    In addition to the primary service, I spent more than 16 minutes to specifically discuss advanced directives and advanced care planning. The patient or appropriate surrogate agreed to or requested to have this discussion.    Signed: Sherrie Mustache, NP on 07/04/2019 at 12:08 PM  Palliative Care Office: [place] Fax: [phone]   Pager [phone] ID# [phone]  Operator [phone]

## 2019-07-05 DIAGNOSIS — R630 Anorexia: Secondary | ICD-10-CM

## 2019-07-05 LAB — CBC AND DIFFERENTIAL
Baso # K/uL: 0 10*3/uL (ref 0.0–0.1)
Basophil %: 1.4 %
Eos # K/uL: 0.1 10*3/uL (ref 0.0–0.5)
Eosinophil %: 10 %
Hematocrit: 26 % — ABNORMAL LOW (ref 40–51)
Hemoglobin: 8.4 g/dL — ABNORMAL LOW (ref 13.7–17.5)
Lymph # K/uL: 0.4 10*3/uL — ABNORMAL LOW (ref 1.3–3.6)
Lymphocyte %: 48.5 %
MCH: 27 pg (ref 26–32)
MCHC: 32 g/dL (ref 32–37)
MCV: 83 fL (ref 79–92)
Mono # K/uL: 0.1 10*3/uL — ABNORMAL LOW (ref 0.3–0.8)
Monocyte %: 14.3 %
Neut # K/uL: 0.2 10*3/uL — ABNORMAL LOW (ref 1.8–5.4)
Nucl RBC # K/uL: 0 10*3/uL (ref 0.0–0.0)
Nucl RBC %: 0 /100 WBC (ref 0.0–0.2)
Platelets: 50 10*3/uL — ABNORMAL LOW (ref 150–330)
RBC: 3.1 MIL/uL — ABNORMAL LOW (ref 4.6–6.1)
RDW: 15.4 % — ABNORMAL HIGH (ref 11.6–14.4)
Seg Neut %: 22.9 %
WBC: 0.7 10*3/uL — ABNORMAL LOW (ref 4.2–9.1)

## 2019-07-05 LAB — COMPREHENSIVE METABOLIC PANEL
ALT: 14 U/L (ref 0–50)
AST: 24 U/L (ref 0–50)
Albumin: 2.6 g/dL — ABNORMAL LOW (ref 3.5–5.2)
Alk Phos: 74 U/L (ref 40–130)
Anion Gap: 11 (ref 7–16)
Bilirubin,Total: 0.2 mg/dL (ref 0.0–1.2)
CO2: 27 mmol/L (ref 20–28)
Calcium: 8.7 mg/dL (ref 8.6–10.2)
Chloride: 99 mmol/L (ref 96–108)
Creatinine: 1.14 mg/dL (ref 0.67–1.17)
GFR,Black: 74 *
GFR,Caucasian: 64 *
Glucose: 93 mg/dL (ref 60–99)
Lab: 25 mg/dL — ABNORMAL HIGH (ref 6–20)
Potassium: 4 mmol/L (ref 3.3–5.1)
Sodium: 137 mmol/L (ref 133–145)
Total Protein: 5.2 g/dL — ABNORMAL LOW (ref 6.3–7.7)

## 2019-07-05 LAB — DIFF MANUAL
Diff Based On: 70 CELLS
React Lymph %: 2.9 % (ref 0.0–6.0)

## 2019-07-05 LAB — RBC MORPHOLOGY

## 2019-07-05 MED ORDER — BUDESONIDE-FORMOTEROL FUMARATE 80-4.5 MCG/ACT IN AERO *I*
2.0000 | INHALATION_SPRAY | Freq: Two times a day (BID) | RESPIRATORY_TRACT | Status: DC
Start: 2019-07-05 — End: 2019-07-11
  Administered 2019-07-05 – 2019-07-11 (×11): 2 via RESPIRATORY_TRACT
  Filled 2019-07-05: qty 6.9

## 2019-07-05 MED ORDER — POLYETHYLENE GLYCOL 3350 PO PACK 17 GM *I*
17.0000 g | PACK | Freq: Every day | ORAL | Status: DC
Start: 2019-07-05 — End: 2019-07-11
  Administered 2019-07-05: 17 g via ORAL
  Filled 2019-07-05 (×5): qty 17

## 2019-07-05 NOTE — Progress Notes (Signed)
MEDICAL ONCOLOGY ATTENDING NOTE:    CC: Back/shoulder blade pain.    Diagnosis: Locally invasive NSCLC, invades into T3/T4    SUBJECTIVE: No events overnight. Started palliative radiation yesterday.  Feels pain is improved since admission.  Continues on PCA. On 10/15 had 20 demands with 16 delivered.  Wants to go home.    Oncologic History  12/2018 - left shoulder blade pain started, declined ED evaluation.   02/16/19 - ED visit for left-sided chest pain. CTA Chest showed irregular posterior pleural base LUL mass (5.4 x 3.3 cm) with post left 4th rib involvement, extension to left superior hilum and left hilar adenopathy.   03/11/19 - Evaluated by [facility] (Dr. Julien Nordmann). Was recommended to have biopsy of LUL mass, with plans for referral to Radiation Oncology. Note stated "highly suspicious stage IIIA (T3, N1, M0)."  03/2019 - ED Melina Fiddler) visit for self-limited hemoptysis. Was treated with course of Augmentin. Declined hospital admission.   06/26/19: C 1 Carbo/Taxol  07/04/19: Begins palliative RT to Thoracic Spine. Opts for no further chemotherapy.    REVIEW OF SYSTEMS: No fevers, chills, chest pain,  Pos for chronic cough, Denies nausea, vomiting, diarrhea, constipation, headaches, rash    OBJECTIVE: Vital signs reviewed  GEN: cachectic no acute distress, appears comfortable  HEENT: anicteric, oropharynx clear, moist mucous membranes  NECK: supple  LYMPH: no palpable adenopathy cervical, supraclavicular bilaterally  CV: regular rate & rhythm; no murmurs, rubs, gallops  LUNGS: clear to auscultation bilaterally  ABD: soft, nontender, nondistended, normoactive bowel sounds, no hepatomegaly  EXT: no edema  NEURO: no focal deficits    Assessment:  73 y/o male with history with locally advanced NSCLC with direct invasion into thoracic spine who presents with uncontrolled pain 1 week after beginning carbo/taxol.    After meeting with palliative care, he was put on PCA with better relief of pain and does  not wish to receive further chemotherapy. Ok with hospice plan of care.  We have opted to administer palliative RT to thoracic spine prior to enrolling on hospice.    Patient was asking to be discharged from hospital today. Told them this was not possible as I worry he would suffer in pain at home without a good home opiate plan. Discucssed that transitioning off a PCA can take 48 hours.     If his plan is to return home and drive back and forth to radiation next week, the earliest he could get home is Sunday/Monday. We can ask palliative care to make recs for transitioning from PCA to a  Home plan, whether that begins today or over the weekend.    Plan:    Recs for PCA as per palliative care   Continue RT   Can give additional dose of neupogen today   No further chemo.    Discussed with Hospitalist Attending Dr. Reinaldo Raddle F. Morton Peters, MD  Assistant Professor of Medicine  Medical Oncology    07/05/2019

## 2019-07-05 NOTE — Interdisciplinary Rounds (Signed)
Interdisciplinary Discharge Communication Note    Expected Discharge Date: 07/11/19   Discharge to: home with services(home hospice)    Rounding was performed on: Date: 07/05/19 at: Time: [place], SW, CC, Charge RN, OT, Diplomatic Services operational officer, PT, Attending    Admit Date/Time:  07/02/2019  5:10 PM    Principal Problem:  <principal problem not specified>  The patient's problem list and interdisciplinary care plan was reviewed.    Discharge Planning    Barriers to Discharge  family meeting;home care set-up    Ongoing Issues: radiation weds- then home hospice   Geographic Patient: Yes    [place], Choiced?: Yes Patient home care agency: [facility]    Medical Equipment Needs  Current Home Equipment: None   Medical Supplies Available: Not Applicable Equipment/Supplies Ordered: None       Delivery of D/C DME/medical supplies confirmed, if applicable: Not Applicable    Teaching Needs  Teaching Needs: N/A        Current PCP:  Margurite Auerbach, MD      Scheduled Future Appointments:   Future Appointments    Tuesday July 16, 2019  9:00 AM  FOLLOW UP VISIT with Dietrich Pates, [place] (--) [phone]   Tuesday July 16, 2019 10:00 AM  CHEMOTHERAPY with [facility]  [place] (--) [phone]   Tuesday July 16, 2019 10:00 AM  FOLLOW UP VISIT with Waldo Laine, [facility] (--) [place], RN  1:51 PM

## 2019-07-05 NOTE — Progress Notes (Signed)
[facility]:      Referral received, spoke with patients significant other [name] regarding discharge planning to [facility]. [name] explains Richard Weaver will remain at [place] until he completes his radiation on Wednesday 10/21. Plan would be to discharge Richard Weaver home on hospice the day after his last radiation for billing purposes, therefore kaesyn would discharge home to [facility] Thursday 10/22. Faxed all information to [facility] [phone]). [place] states they will likely reach out to patient Monday to begin discharge planning.         [name], RN   Hospice Evaluator   Personal cell: [phone]  Office: [phone]  After hours: [phone]

## 2019-07-05 NOTE — Progress Notes (Signed)
Spoke with pt and SO, [name], regarding discharge plans. Pt agreeable to stay inpatient until radiation is completed on Wednesday, which will also allow more  time to arrange a PCA with [facility]. Hospice and palliative care notified.     Robinette Haines [place], [place]

## 2019-07-05 NOTE — Plan of Care (Signed)
Medical record reviewed for home care, will follow for progress to d/c /home care needs  Abiola Behring Rn  Home care Coordinator, [facility]   [phone]        For weekend home care need/discharge please contact [facility] AT [phone]  Fax [phone] or [place] social work covering the unit

## 2019-07-05 NOTE — Progress Notes (Signed)
Palliative Care Inpatient Progress Note     LOS: 3 days     Subjective:   Chart reviewed; events noted.    Richard Weaver was lying in bed.     His significant other was at bedside.     Richard Weaver was asleep initially, but woke up shortly after my arrival.   He tells me that his pain is not well controlled.  He informs me that his dilaudid PCA frequency was increased to q8mn from q668m this morning.  He has not noticed any better relief of pain, and checked his PCA button several times during encounter anticipating the opportunity to administer another dose. I asked him if dose of pain medication is helping, but he isn't able to provide a clear yes/no answer.  He reports pain becomes significantly worse with movement.    Richard Weaver first radiation treatment yesterday, second today.  He says this is going well so far.  He tells me "I feel no different".    Richard Weaver me that he would like to leave to go home on Sunday.   He worries that the longer he remains in the hospital the larger his hospital bill will be.  I admitted that I don't have the answers to questions about insurance coverage of his hospital stay, but offered to pass his concerns along to the social work team.  He appreciated this.    I encouraged him to remain in the hospital to complete radiation treatment.  We also discussed his need for a PCA pump, and I explained that he would need to be set up with home hospice services prior to returning home in order to have PCA pump set up.  Because Richard Weaver outside of [place] it will take longer to get home hospice services setup, so this is not likely to be done before Sunday.  After encouraging Richard Weaver to stay, he replied "I guess I will if I have to".    Richard Weaver (Richard Weaver's significant other) expressed some worry about bringing [place] too soon.  She worries that without adequate pain control, she won't be able to care for Richard Weaver.  I provided some reassurance, letting her know that  we will not send Richard Weaver without getting symptoms properly managed first.  I also provided some information about home hospice services.  She was relieved to hear.    Palliative Care Review of Systems:  Pain   Moderate  Nausea   None  Anorexia   Severe  Anxiety   None  Shortness of Breath   Mild  Tiredness/Fatigue   Moderate  Drowsiness/Sleepiness   None    Physical Exam:  Exam is limited but as noted.    General Exam: cachectic.  Lying in bed.  Appears comfortable  Mental State: Alert, fully oriented.   Irritable.    Assessment/Plan:  This is a case of cancer-associated pain, fatigue, anorexia, FTT in a [age].o. male with stage III lung cancer.  Decision made on 10/15 for CMO.  He would like to complete palliative RT and [facility].    RECOMMENDATIONS:  - For pain, Richard Weaver used total of 9.5 mg IV dilaudid in last 24 hours.  Please add continuous rate to IV dilaudid PCA of 0.5 mg/hr, and increase availability of PRN bolus to 0.5 mg q1560mPRN.  Please discontinue extra order of IV dilaudid 0.5 mg q3h PRN.  - Please continue palliative RT for now.  If patient decides to leave hospital before completing, it will be too burdensome (physically and financially) to get him to-from the hospital daily to complete RT over the next week.  If he insists on leaving, will need to discuss discontinuing RT treatment.   Other option is for him to agree to stay in the hospital to complete RT treatment.  He is considering this.   - If Richard Weaver leaves before being set up with home hospice services, he will not be able to leave with IV dilaudid PCA.  Please have inpatient hospice team initiate this process with [facility].  - Please have SW discuss details of insurance coverage with patient.  Patient has questions and needs reassurance regarding payment for hospital stay.   Richard Weaver will need COVID test prior to leaving, in order to qualify for [place] services    DNR/DNI     Care coordination: patient, family, [facility] staff    D/C Plan: home with hospice services    Total patient care time on the unit was more than 35 minutes.  More than 50% of this service was  Counseling and coordination of care.    Signed: Dellie Catholic, MD on 07/05/2019 at 1:15 PM  Palliative Care Office: [place] Fax: [phone]   Pager [phone]  Operator [phone]

## 2019-07-05 NOTE — Progress Notes (Signed)
Hospital Medicine Daily Progress Note                                              LOS: 3 day    Significant 24-hour Events:   No significant events    Subjective:   Patient tolerated radiation treatment yesterday and just came back from today's treatment.  Patient is insistent on going home as soon as it possible, due to ongoing PCA, the earliest date of discharge will likely be 'Sunday and patient and his wife interested in pursuing this goal.    Objective:     Vitals:    07/05/19 1112   BP: 102/52   Pulse: 64   Resp: 16   Temp: 36.8 C (98.2 F)   Weight:    Height:      BP: (102-122)/(50-55)   Temp:  [36.2 C (97.2 F)-37 C (98.6 F)]   Temp src: Temporal (10/16 1112)  Heart Rate:  [61-82]   Resp:  [16]   SpO2:  [97 %-100 %]      Intake/Output Summary (Last 24 hours) at 07/05/2019 1220  Last data filed at 07/05/2019 0924  Gross per 24 hour   Intake 1116.24 ml   Output 700 ml   Net 416.24 ml     Physical Exam:  Constitutional: NAD, thin, cachetic, frail.   HENT: normocephalic, atraumatic  Mouth/Throat: MMM; clear oropharynx  Neck: no JVD  CV: RRR, normal S1/S2; no M/R/G  Pulmonary: normal WOB; CTA bilaterally; scattered wheezes, no rhonchi, no crackles  Abdominal: BS normal; soft, non TTP, non-distended    Recent Lab, Micro, and Imaging Studies   Basic Metabolic Panel CBC   Recent Labs     07/05/19  0231 07/04/19  0526 07/03/19  0417   Sodium 137 134 136   Potassium 4.0 4.1 4.2   Chloride 99 94* 97   CO2 27 27 26   UN 25* 30* 38*   Creatinine 1.14 1.23* 1.19*   Glucose 93 127* 94   Calcium 8.7 8.6 8.9   Magnesium  --   --  2.1     No components found with this basename: PHOS      Recent Labs     07/05/19  0231 07/04/19  0526 07/03/19  0417   WBC 0.7* 0.7* 1.7*   Hemoglobin 8.4* 8.9* 10.0*   Hematocrit 26* 27* 31*   Platelets 50* 65* 96*      Liver Panel Other Labs   Recent Labs     10' /16/20  0231 07/04/19  0526 07/03/19  0417   AST '24 16 18   ' ALT '14 10 11   ' Alk Phos 74 78 86   Total Protein 5.2* 5.4* 5.8*    Albumin 2.6* 2.7* 2.9*   Bilirubin,Total 0.2 0.3 0.3     No results for input(s): INR, CRP, ESR, TROP, CK, MCKMB, BNP in the last 72 hours.    No components found with this basename: PT, APT, TROPONINI, TROPONINT, CKTOTAL, CKMBINDEX  No results for input(s): CHOL, HDL, LDL, TRIG in the last 72 hours.  No results for input(s): APH, APCO2, APO2, AHCO3, ABE in the last 168 hours.  No results for input(s): VPH, VPCO2, VPO2, VHCO3, VBE in the last 168 hours.     Micro results   Aerobic Culture   Date Value Ref Range Status  07/03/2019 Lab Cancel  Final          Radiology results   Chest Single Frontal View    Result Date: 07/03/2019  Worsening airspace opacity in the left upper lobe. This likely relates to the patient's neoplasm plus or minus a postobstructive process which could relate to pneumonia or atelectasis. END OF IMPRESSION [facility] submits this DICOM format image data and final report to the [facility], an independent secure electronic health information exchange, on a reciprocally searchable basis (with patient authorization) for a minimum of 12 months after exam date.       Assessment:   Richard Weaver is a 73 y.o. male with history of stage III lung cancer who presented with failure to thrive and uncontrolled pain, currently being treated for postobstructive pneumonia.    Stage III lung cancer, Non small cell lung cancer   Status post 1 cycle of chemotherapy  Plan to transition to hospice and discontinue chemotherapy treatments    Pancytopenia secondary to chemotherapy   transfusion of blood products not indicated today  Start on Neupogen day 2 of 3  Start neutropenic precautions  D/c Lovenox    Postobstructive pneumonia, present on admission  Sputum cultures are growing both gram-positive cocci, gram-negative bacilli and yeast  Will await final speciation and sensitivities  Continue with Zosyn, day 3/7     Acute on chronic COPD exacerbation, resolving  Transition to Symbicort   Supplemental oxygen as  needed  Goal O2 sats is 88 to 92%.    Cancer related pain , T3-T4 infiltration  Palliative care involved  Palliative care to transition off PCA  C/w Palliative radiation treatments  day 2of 5    Plan:     DVT Prophylaxis: Contraindicated due to thrombocytopenia  PUD Prophylaxis: Protonix  CODE STATUS:DNR/DNI    Disposition: Home with hospice    Dallie Dad, MD on 07/05/2019 at 12:20 PM

## 2019-07-05 NOTE — Plan of Care (Signed)
Interdisciplinary Rounding Note   Richard Weaver was discussed today during health team rounds.   Problems addressed:  Active Problems:    Uncontrolled pain      The plan of care was reviewed with the following health team members:  Charge Nurse  Social Worker  Attending Physician  Physician Assistant  [place] Coordinator  Physical Therapist      Carolin Sicks, RN 07/05/2019 10:10 AM

## 2019-07-05 NOTE — Progress Notes (Signed)
Radiation treatment #2 of 5 given today.

## 2019-07-06 DIAGNOSIS — F419 Anxiety disorder, unspecified: Secondary | ICD-10-CM

## 2019-07-06 LAB — CBC AND DIFFERENTIAL
Baso # K/uL: 0 10*3/uL (ref 0.0–0.1)
Basophil %: 0 %
Eos # K/uL: 0 10*3/uL (ref 0.0–0.5)
Eosinophil %: 1 %
Hematocrit: 27 % — ABNORMAL LOW (ref 40–51)
Hemoglobin: 8.5 g/dL — ABNORMAL LOW (ref 13.7–17.5)
Lymph # K/uL: 0.5 10*3/uL — ABNORMAL LOW (ref 1.3–3.6)
Lymphocyte %: 29 %
MCH: 27 pg (ref 26–32)
MCHC: 32 g/dL (ref 32–37)
MCV: 85 fL (ref 79–92)
Mono # K/uL: 0.2 10*3/uL — ABNORMAL LOW (ref 0.3–0.8)
Monocyte %: 13 %
Neut # K/uL: 0.8 10*3/uL — ABNORMAL LOW (ref 1.8–5.4)
Nucl RBC # K/uL: 0 10*3/uL (ref 0.0–0.0)
Nucl RBC %: 0 /100 WBC (ref 0.0–0.2)
Platelets: 39 10*3/uL — ABNORMAL LOW (ref 150–330)
RBC: 3.2 MIL/uL — ABNORMAL LOW (ref 4.6–6.1)
RDW: 15.3 % — ABNORMAL HIGH (ref 11.6–14.4)
Seg Neut %: 45 %
WBC: 1.6 10*3/uL — ABNORMAL LOW (ref 4.2–9.1)

## 2019-07-06 LAB — DIFF MANUAL
Bands %: 7 % (ref 0–10)
Diff Based On: 100 CELLS
Metamyelocyte %: 1 % (ref 0–1)
Myelocyte %: 1 % — ABNORMAL HIGH (ref 0–0)
React Lymph %: 3 % (ref 0.0–6.0)

## 2019-07-06 MED ORDER — ALBUTEROL SULFATE (2.5 MG/3ML) 0.083% IN NEBU *I*
2.5000 mg | INHALATION_SOLUTION | Freq: Four times a day (QID) | RESPIRATORY_TRACT | Status: DC | PRN
Start: 2019-07-06 — End: 2019-07-06

## 2019-07-06 MED ORDER — IPRATROPIUM-ALBUTEROL 0.5-2.5 MG/3ML IN SOLN *I*
3.0000 mL | Freq: Four times a day (QID) | RESPIRATORY_TRACT | Status: DC | PRN
Start: 2019-07-06 — End: 2019-07-11

## 2019-07-06 NOTE — Progress Notes (Signed)
Palliative Care Inpatient Progress Note     LOS: 4 days     Subjective:   Chart reviewed; events noted.    Plan to remain in hospital to complete XRT and abx then d/c home with hospice on PCA.    He continues on hydromorphone PCA at 0.'5mg'$ /hr + 0.'5mg'$  q 15 minutes prn. His pain scores per nursing assessment have decreased though his demands remain high- 40 demands, 31 doses. Total used- ~20 mg IV hydromorphone in ~12 hours.     He feels is pain is OK overall, a bit dyspneic. Thinks the adjustments in the PCA was at least somewhat helpful. Does not feel adjustments are needed right now.    He used 1 dose of lorazepam overnight for insomnia and anxiety    History limited by patient condition/circumstance.      Palliative Care Review of Systems:  Anorexia   Mild  Shortness of Breath   Mild  Tiredness/Fatigue   Moderate  ROS negative except as noted above.      Physical Exam:  Exam is limited but as noted.    Gen: awake, alert. Appears frail and chronically ill, no acute distress  Resp: even, unlabored  Neurologic: Grossly normal, alert, answers questions appropriately  Psych: Engages  in conversation. Mood and affect normal.        Assessment/Plan:  This is a case of severe cancer related pain, anxiety, FTT, severe protein calorie malnutrition in a 74 y.o. male with advanced lung cancer, plan for home hospice after XRT completed next week.     Symptom Management: high PCA demands but seems comfortable on exam and per nursing documentation. He does not feel adjustment is needed. Would continue to monitor for need for adjustments. Primary team also changing to duonebs for some suspected component of bronchospasm.     Need to monitor for decline given increase in opiate need but seems stable to me today.     Other Recommendations: continue supportive care. Complete ABX/ XRTcourse as long as he remains stable. Consider stopping statin/ASA        DNR/DNI     Care coordination: discussed with primary team, Dr. Roanna Banning    D/C  Plan: pending XRT, plan for home hospice next week      Signed: Berneice Heinrich, MD on 07/06/2019 at 9:19 AM  Palliative Care Office: [phone]   [place] Fax: [phone]   Pager [phone] ID#   Operator [phone]

## 2019-07-06 NOTE — Progress Notes (Signed)
Hospital Medicine Daily Progress Note                                              LOS: 4 day    Significant 24-hour Events:   No significant events    Subjective:   Patient interviewed with his significant other, [name], on the phone.  He reports some trouble breathing and increased sputum production overnight.  He received Symbicort around 1 PM yesterday, patient apparently had refused the morning dose.  He is using his abdominal muscles in breathing and he presses the PCA button for shortness of breath.    Objective:     Vitals:    07/06/19 1151   BP: 102/74   Pulse: 64   Resp: 16   Temp: 36.6 C (97.9 F)   Weight:    Height:      BP: (102-118)/(52-74)   Temp:  [36.6 C (97.9 F)-36.9 C (98.4 F)]   Temp src: Temporal (10/17 1151)  Heart Rate:  [60-66]   Resp:  [16]   SpO2:  [91 %-97 %]      Intake/Output Summary (Last 24 hours) at 07/06/2019 1154  Last data filed at 07/06/2019 0900  Gross per 24 hour   Intake 480 ml   Output 200 ml   Net 280 ml     Physical Exam:  Constitutional: NAD, thin, cachetic, frail, mild respiratory distress  HENT: normocephalic, atraumatic  Mouth/Throat: MMM; clear oropharynx  Neck: no JVD  CV: RRR, normal S1/S2; no M/R/G  Pulmonary: normal WOB; CTA bilaterally; scattered wheezes, no rhonchi, no crackles  Abdominal: BS normal; soft, non TTP, non-distended    Recent Lab, Micro, and Imaging Studies   Basic Metabolic Panel CBC   Recent Labs     07/05/19  0231 07/04/19  0526   Sodium 137 134   Potassium 4.0 4.1   Chloride 99 94*   CO2 27 27   UN 25* 30*   Creatinine 1.14 1.23*   Glucose 93 127*   Calcium 8.7 8.6     No components found with this basename: PHOS      Recent Labs     07/06/19  0606 07/05/19  0231 07/04/19  0526   WBC 1.6* 0.7* 0.7*   Hemoglobin 8.5* 8.4* 8.9*   Hematocrit 27* 26* 27*   Platelets 39* 50* 65*      Liver Panel Other Labs   Recent Labs     07/05/19  0231 07/04/19  0526   AST 24 16   ALT 14 10   Alk Phos 74 78   Total Protein 5.2* 5.4*   Albumin 2.6* 2.7*    Bilirubin,Total 0.2 0.3     No results for input(s): INR, CRP, ESR, TROP, CK, MCKMB, BNP in the last 72 hours.    No components found with this basename: PT, APT, TROPONINI, TROPONINT, CKTOTAL, CKMBINDEX  No results for input(s): CHOL, HDL, LDL, TRIG in the last 72 hours.  No results for input(s): APH, APCO2, APO2, AHCO3, ABE in the last 168 hours.  No results for input(s): VPH, VPCO2, VPO2, VHCO3, VBE in the last 168 hours.     Micro results   Aerobic Culture   Date Value Ref Range Status   07/03/2019 Lab Cancel  Final          Radiology results   No results  found.     Assessment:   Richard Weaver is a 73 y.o. male with history of stage III lung cancer who presented with failure to thrive and uncontrolled pain, currently being treated for postobstructive pneumonia.    Stage III lung cancer, Non small cell lung cancer   Status post 1 cycle of chemotherapy  Plan to transition to hospice and discontinue chemotherapy treatments    Pancytopenia secondary to chemotherapy   transfusion of blood products not indicated today  Start on Neupogen day 3 of 3  Start neutropenic precautions  D/c Lovenox    Postobstructive pneumonia, present on admission  Sputum cultures are growing both gram-positive cocci, gram-negative bacilli and yeast, cultures not finalized due to concern of contamination  Continue with Zosyn, day 4/7     Acute on chronic COPD exacerbation, resolving  Transition to Symbicort, encourage compliance  Supplemental oxygen as needed  Goal O2 sats is 88 to 92%.    Cancer related pain , T3-T4 infiltration  Palliative care involved  Continue with PCA as per palliative care  C/w Palliative radiation treatments  day 2 of 5 (last day is Wednesday)    Plan:     DVT Prophylaxis: Contraindicated due to thrombocytopenia  PUD Prophylaxis: Protonix  CODE STATUS:DNR/DNI    Disposition: Home with hospice    Dallie Dad, MD on 07/06/2019 at 11:54 AM

## 2019-07-06 NOTE — Progress Notes (Signed)
Spacer teach completed.

## 2019-07-07 LAB — RBC MORPHOLOGY

## 2019-07-07 LAB — CBC AND DIFFERENTIAL
Baso # K/uL: 0 10*3/uL (ref 0.0–0.1)
Basophil %: 1 %
Eos # K/uL: 0.1 10*3/uL (ref 0.0–0.5)
Eosinophil %: 2 %
Hematocrit: 29 % — ABNORMAL LOW (ref 40–51)
Hemoglobin: 9.3 g/dL — ABNORMAL LOW (ref 13.7–17.5)
Lymph # K/uL: 0.6 10*3/uL — ABNORMAL LOW (ref 1.3–3.6)
Lymphocyte %: 18 %
MCH: 27 pg (ref 26–32)
MCHC: 32 g/dL (ref 32–37)
MCV: 85 fL (ref 79–92)
Mono # K/uL: 0.1 10*3/uL — ABNORMAL LOW (ref 0.3–0.8)
Monocyte %: 3 %
Neut # K/uL: 2.7 10*3/uL (ref 1.8–5.4)
Nucl RBC # K/uL: 0 10*3/uL (ref 0.0–0.0)
Nucl RBC %: 0 /100 WBC (ref 0.0–0.2)
Platelets: 29 10*3/uL — ABNORMAL LOW (ref 150–330)
RBC: 3.4 MIL/uL — ABNORMAL LOW (ref 4.6–6.1)
RDW: 15.6 % — ABNORMAL HIGH (ref 11.6–14.4)
Seg Neut %: 71 %
WBC: 3.5 10*3/uL — ABNORMAL LOW (ref 4.2–9.1)

## 2019-07-07 LAB — DIFF MANUAL
Bands %: 5 % (ref 0–10)
Diff Based On: 100 CELLS

## 2019-07-07 NOTE — Progress Notes (Signed)
Hospital Medicine Daily Progress Note                                              LOS: 5 day    Significant 24-hour Events:   No significant events    Subjective:   Patient seen and examined at the bedside, significant other, [name] is at the bedside.  I discussed his severe thrombocytopenia, platelet of 29 with them.  They would like to continue on daily CBC and possible platelet transfusion if the platelet is below 20.  We discussed precautions such as avoiding shaving and teeth brushing with hard toothbrush.  They however understand that on discharge, he is going on hospice and would no longer be receiving any form of transfusions.    Objective:     Vitals:    07/07/19 0742   BP: 128/60   Pulse: 100   Resp: 20   Temp: 36.6 C (97.9 F)   Weight:    Height:      BP: (102-136)/(60-80)   Temp:  [36.5 C (97.7 F)-37 C (98.6 F)]   Temp src: Temporal (10/18 0742)  Heart Rate:  [64-110]   Resp:  [16-24]   SpO2:  [84 %-98 %]      Intake/Output Summary (Last 24 hours) at 07/07/2019 1052  Last data filed at 07/07/2019 9563  Gross per 24 hour   Intake 440 ml   Output 600 ml   Net -160 ml     Physical Exam:  Constitutional: NAD, thin, cachetic, frail, mild respiratory distress  HENT: normocephalic, atraumatic  Mouth/Throat: MMM; clear oropharynx  Neck: no JVD  CV: RRR, normal S1/S2; no M/R/G  Pulmonary: normal WOB; CTA bilaterally; scattered wheezes, no rhonchi, no crackles  Abdominal: BS normal; soft, non TTP, non-distended    Recent Lab, Micro, and Imaging Studies   Basic Metabolic Panel CBC   Recent Labs     07/05/19  0231   Sodium 137   Potassium 4.0   Chloride 99   CO2 27   UN 25*   Creatinine 1.14   Glucose 93   Calcium 8.7     No components found with this basename: PHOS      Recent Labs     07/07/19  0302 07/06/19  0606 07/05/19  0231   WBC 3.5* 1.6* 0.7*   Hemoglobin 9.3* 8.5* 8.4*   Hematocrit 29* 27* 26*   Platelets 29* 39* 50*      Liver Panel Other Labs   Recent Labs     07/05/19  0231   AST 24   ALT 14   Alk  Phos 74   Total Protein 5.2*   Albumin 2.6*   Bilirubin,Total 0.2     No results for input(s): INR, CRP, ESR, TROP, CK, MCKMB, BNP in the last 72 hours.    No components found with this basename: PT, APT, TROPONINI, TROPONINT, CKTOTAL, CKMBINDEX  No results for input(s): CHOL, HDL, LDL, TRIG in the last 72 hours.  No results for input(s): APH, APCO2, APO2, AHCO3, ABE in the last 168 hours.  No results for input(s): VPH, VPCO2, VPO2, VHCO3, VBE in the last 168 hours.     Micro results   Aerobic Culture   Date Value Ref Range Status   07/03/2019 Lab [place]  Final          Radiology  results   No results found.     Assessment:   Richard Weaver is a 73 y.o. male with history of stage III lung cancer who presented with failure to thrive and uncontrolled pain, currently being treated for postobstructive pneumonia and receiving radiation treatments for T3-T4 malignant infiltration.     Stage III lung cancer, Non small cell lung cancer   Status post 1 cycle of chemotherapy  Plan to transition to hospice and discontinue chemotherapy treatments    Pancytopenia secondary to chemotherapy   transfusion of blood products not indicated today  S/p  Neupogen x 3  Start neutropenic precautions  D/c Lovenox  Discussed with oncology, platelet transfusion if platelets fall below 20    Postobstructive pneumonia, present on admission  Sputum cultures are growing both gram-positive cocci, gram-negative bacilli and yeast, cultures not finalized due to concern of contamination  Continue with Zosyn, day 5/7     Acute on chronic COPD exacerbation, resolving  Transition to Symbicort, encourage compliance  Supplemental oxygen as needed  Goal O2 sats is 88 to 92%.    Cancer related pain , T3-T4 infiltration  Palliative care involved  Continue with PCA as per palliative care  C/w Palliative radiation treatments  day 2 of 5 (last day is Wednesday)    Plan:     DVT Prophylaxis: Contraindicated due to thrombocytopenia  PUD Prophylaxis: Protonix  CODE  STATUS:DNR/DNI    Disposition: Home with hospice    Dallie Dad, MD on 07/07/2019 at 10:52 AM

## 2019-07-07 NOTE — Progress Notes (Signed)
Patient's platelet counts noted.  We will hold radiation on 10/19 until counts>50.

## 2019-07-08 ENCOUNTER — Telehealth: Payer: Self-pay | Admitting: Radiation Oncology

## 2019-07-08 DIAGNOSIS — D61818 Other pancytopenia: Secondary | ICD-10-CM

## 2019-07-08 LAB — CBC AND DIFFERENTIAL
Baso # K/uL: 0 10*3/uL (ref 0.0–0.1)
Basophil %: 0 %
Eos # K/uL: 0.1 10*3/uL (ref 0.0–0.5)
Eosinophil %: 3 %
Hematocrit: 27 % — ABNORMAL LOW (ref 40–51)
Hemoglobin: 8.5 g/dL — ABNORMAL LOW (ref 13.7–17.5)
Lymph # K/uL: 0.5 10*3/uL — ABNORMAL LOW (ref 1.3–3.6)
Lymphocyte %: 12 %
MCH: 27 pg (ref 26–32)
MCHC: 31 g/dL — ABNORMAL LOW (ref 32–37)
MCV: 85 fL (ref 79–92)
Mono # K/uL: 0.1 10*3/uL — ABNORMAL LOW (ref 0.3–0.8)
Monocyte %: 2 %
Neut # K/uL: 3.7 10*3/uL (ref 1.8–5.4)
Nucl RBC # K/uL: 0 10*3/uL (ref 0.0–0.0)
Nucl RBC %: 0 /100 WBC (ref 0.0–0.2)
Platelets: 24 10*3/uL — ABNORMAL LOW (ref 150–330)
RBC: 3.2 MIL/uL — ABNORMAL LOW (ref 4.6–6.1)
RDW: 15.7 % — ABNORMAL HIGH (ref 11.6–14.4)
Seg Neut %: 80 %
WBC: 4.4 10*3/uL (ref 4.2–9.1)

## 2019-07-08 LAB — DIFF MANUAL
Bands %: 3 % (ref 0–10)
Diff Based On: 100 CELLS

## 2019-07-08 LAB — RBC MORPHOLOGY

## 2019-07-08 MED ORDER — LORAZEPAM 0.5 MG PO TABS *I*
0.5000 mg | ORAL_TABLET | Freq: Every evening | ORAL | Status: DC
Start: 2019-07-08 — End: 2019-07-10
  Administered 2019-07-08 – 2019-07-09 (×2): 0.5 mg via ORAL
  Filled 2019-07-08 (×2): qty 1

## 2019-07-08 MED ORDER — LORAZEPAM 0.5 MG PO TABS *I*
0.5000 mg | ORAL_TABLET | Freq: Four times a day (QID) | ORAL | Status: DC | PRN
Start: 2019-07-08 — End: 2019-07-11

## 2019-07-08 NOTE — Progress Notes (Signed)
Richard Weaver  Length of stay: 6    Active Problems  1) Cancer-related pain in setting of Stage 3 NSCLC  2) Pancytopenia  3) Failure to Thrive  4) Postobstructive PNA    Interval Events/Subjective     Interval Events/Subjective:   Doing well this AM.   Pain well controlled on PCA. Breathing is improved.  Denies other concerns at this time.  Upset radiation treatment is being held today in the setting of thrombocytopenia. Insistent he will go home thursday regardless of number of radiation treatments.    ROS: Denies Fever, Chills, HA, CP, Palpitations, SOB, abd pain, N/V/D.    Objective     Vitals : Blood pressure 125/65, pulse 78, temperature 36.5 C (97.7 F), temperature source Temporal, resp. rate 18, height 1.626 m ('5\' 4"'$ ), weight (!) 44 kg (97 lb), SpO2 96 %.  Vitals:    07/08/19 0732   BP: 125/65   Pulse: 78   Resp: 18   Temp: 36.5 C (97.7 F)   Weight:    Height:     BP: (108-130)/(60-72)   Temp:  [36.5 C (97.7 F)-36.7 C (98.1 F)]   Temp src: Temporal (10/19 0732)  Heart Rate:  [72-100]   Resp:  [18-20]   SpO2:  [94 %-99 %]      Physical exam  Gen: Cachetic Frail male. In mild distress  CVS: Normal rate, regular rhythm, physiologic s1/s2, no M/R/G  Pulm: Ronchorous sound sin LUL otherwise CTA. Prolong expiratory phase  Abd: Soft and nontender, non-distended, BS present     Labs and imaging reviewed and at baseline for pateint.  Notable findings highlighted in assessment/plan below.    Assessment/Plan   Richard Weaver is a 73 y.o.male with Stage 3 NSCLc on chemotherapy (T3/T4 invasion) admitted for the management of uncontrolled pain and failure to thrive.    # Cancer-related pain in setting of Stage 3 NSCLC: CT scan demonstrating a mass in theLUL that measures 43.16m x 63.886m with concomitant PET aviditywithSUV max 14 which was eroding the T3, T4 vertebraeand posterior ribs 3-5. S/p 1 cycle chemotherapy.  -  Plan to d/c chemo and transition to hospice  -  Palliative  Radiation, Plan for 5d. Held 10/19 in setting of PLT has received 2/5d, needs PLT >50k for therapy. Rad Once Following  -  Palliative care involved; Continue with PCA as per palliative care.Mirilax + Dulolax    # Pancytopenia 2/2 Chemotherapy: S/p Nupgen x3. Hct/Hb 8.5/27, Plt 24.  WBC 4.4  -  Neutropenic precautions  -  HOLD anticoag; platelet transfusion if platelets fall below 20    # Failure to Thrive:  -  Nutrition following:     Resolving Issues:  # Post obstructive PNA Symbicort, Supplemental O2 PRN. Goal O2 >88%  - Zosyn Last day 10/20    Chronic Issue:  # COPD s/p excerbation: Symbicort, Supplemental O2 PRN. Goal O2 >88%    PPx/other:   - VTE: N/a in setting of thrombocytopenia   - PUD: Protonix   Diet: Dietary nutrition supplements adult:  Diet regular     Code Status: DNR/DNI     Dispo: Home ospice    Richard BuntingMD  Family Medicine Resident  07/08/19   8:01 AM   PIC [phone]

## 2019-07-08 NOTE — Interdisciplinary Rounds (Signed)
Interdisciplinary Discharge Communication Note    Expected Discharge Date: 07/11/19   Discharge to: home with services(home hospice)    Rounding was performed on: Date: 07/08/19 at: Time: [place], SW, CC, Charge RN, Diplomatic Services operational officer, PT, Attending    Admit Date/Time:  07/02/2019  5:10 PM    Principal Problem:  <principal problem not specified>  The patient's problem list and interdisciplinary care plan was reviewed.    Discharge Planning    Barriers to Discharge  home care set-up    Ongoing Issues: radiation weds- then home hospice   Geographic Patient: Yes    [place], Choiced?: Yes Patient home care agency[facility]    Medical Equipment Needs  Current Home Equipment: None   Medical Supplies Available: Not Applicable Equipment/Supplies Ordered: None       Delivery of D/C DME/medical supplies confirmed, if applicable: Not Applicable    Teaching Needs  Teaching Needs: N/A        Current PCP:  Margurite Auerbach, MD      Scheduled Future Appointments:   Future Appointments    Tuesday July 16, 2019  9:00 AM  FOLLOW UP VISIT with Dietrich Pates, [place] (--) [phone]   Tuesday July 16, 2019 10:00 AM  CHEMOTHERAPY with [facility], POD F  [place] (--) [phone]   Tuesday July 16, 2019 10:00 AM  FOLLOW UP VISIT with Waldo Laine, [facility] (--) [phone]   Wednesday August 07, 2019  9:00 AM  FOLLOW UP VISIT with Lovie Chol, MD  [facility] (--) [phone]   Wednesday August 07, 2019 10:00 AM  CHEMOTHERAPY with [facility], POD H  [place] (--) [place], LMSW  2:05 PM

## 2019-07-08 NOTE — Progress Notes (Signed)
Pt due for nutrition reassessment and follow up this date. Noted pt's care status as comfort measures only with plan for home hospice. Patient's current diet order is regular; will continue to provide unless otherwise notified.  Please consult if patient's care status changes or further recommendations are needed.    Shelda Altes, [place]  Clinical Nutrition  Pager [phone], Phone [phone]

## 2019-07-08 NOTE — Telephone Encounter (Signed)
I contacted Richard Weaver about this patient.  I will defer to my medical oncology colleague regarding platelet transfusions.  At this moment, the patient's pain seems to be under control.  We will continue to hold treatment until his platelet count reaches 50.

## 2019-07-08 NOTE — Telephone Encounter (Signed)
Derrick called inquiring if patient would need a platelet transfusion in hopes of receiving Radiation Therapy tomorrow, 07/09/19. Please call to discuss.

## 2019-07-08 NOTE — Interdisciplinary Rounds (Signed)
PALLIATIVE CARE INTERDISCIPLINARY TEAM ROUNDS:    Date: 07/09/2019   Time: 11:01 AM   Attendance:  Palliative Care Attending  Palliative Care APP  Social Work  Chaplain  Hospice Nurse      Admit Date/Time:  07/02/2019  5:10 PM    The patient's problem list and interdisciplinary care plan was reviewed.    PC symptom scales being addressed:  Pain   Mild  Anxiety   Mild  Tiredness/Fatigue   Mild  Airway Secretions   thick yellow secretions    Advance Directives:    DNI: yes    DNR: yes    Health Care Proxy Name/Relationship: Richard Weaver, significant other    Hospice: '[]'$  Yes   [x ] No      Hospice pending -- to go home with hospice in [facility] later this week  Agency: '[ ]'$  [facility]   '[ ]'$  [facility]   '[ ]'$  Other:     Spiritual/Existential scales:  Existential Suffering: None  Spiritual Distress: None    Please see Social Work and Interior and spatial designer for additional details.

## 2019-07-08 NOTE — Progress Notes (Signed)
Palliative Care Inpatient Progress Note     LOS: 6 days     Subjective:   Chart reviewed; events noted.   Richard Weaver was lying in bed this morning, [name] at bedside.   He tells Probation officer that XRT is on hold due to his low platelets. He thinks that he will get a transfusion if he drops below 20. He is unsure if the XRT is helping-- he and [name] ask if there is any benefit (in terms of prognosis) of getting the full 5 XRT treatments.   [name] tells Probation officer that Richard Weaver is going home on Thursday, even if XRT hasn't been completed.     Richard Weaver is happy with his pain control. He reports some SOB and anxiety between midnight and 3 am. He tells Probation officer that he was feeling anxious about his IV line (worried that the dressing was loose)    Comfort meds/24 hours:   Hydromorphone PCA 0.5 mg/hr with 0.5 mg q 15 minutes: 40 mg total, 70 demands, 56 deliveries.     History limited by patient condition/circumstance.    Palliative Care Review of Systems:  Pain   Mild  Anxiety   Mild  Tiredness/Fatigue   Mild  Airway Secretions   thick yellow sputum      Physical Exam:  Exam is limited but as noted.    General Exam: frail, cachetic male, in bed   Mental State: pleasant, talkative, sharing stories and photos today     Assessment/Plan:  This is a case of severe cancer related pain, anxiety, FTT, severe protein calorie malnutrition in a 73 y.o. male with advanced lung cancer, plan for home hospice after XRT completed next week.     Symptom Management: Continues with high PCA demands but he feels comfortable and does not think that changes need to be made.     Anxiety: Richard Weaver reports significant anxiety at night. Could consider lorazepam 0.5 mg PO q HS.      Other Recommendations: Continue plan for now with goal of d/c home Thursday-- will be opened to [employer]     If plts low again tomorrow, Richard Weaver and [name] may want to speak about stopping XRT.         DNR/DNI     Care coordination: Pall care team, nursing    D/C Plan: home later this week with  [facility]    My total patient care time on the unit >25'; >50% counseling and coordination of care.    Signed: Sherrie Mustache, NP on 07/08/2019 at 11:36 AM  Palliative Care Office: [phone]   Palliative Care Fax: [phone]   Pager [phone] ID# [id number]  Operator [phone]

## 2019-07-09 LAB — CBC AND DIFFERENTIAL
Baso # K/uL: 0 10*3/uL (ref 0.0–0.1)
Basophil %: 0 %
Eos # K/uL: 0.1 10*3/uL (ref 0.0–0.5)
Eosinophil %: 3 %
Hematocrit: 26 % — ABNORMAL LOW (ref 40–51)
Hemoglobin: 8.2 g/dL — ABNORMAL LOW (ref 13.7–17.5)
Lymph # K/uL: 0.8 10*3/uL — ABNORMAL LOW (ref 1.3–3.6)
Lymphocyte %: 18 %
MCH: 27 pg (ref 26–32)
MCHC: 32 g/dL (ref 32–37)
MCV: 86 fL (ref 79–92)
Mono # K/uL: 0.1 10*3/uL — ABNORMAL LOW (ref 0.3–0.8)
Monocyte %: 2 %
Neut # K/uL: 3.3 10*3/uL (ref 1.8–5.4)
Nucl RBC # K/uL: 0 10*3/uL (ref 0.0–0.0)
Nucl RBC %: 0 /100 WBC (ref 0.0–0.2)
Platelets: 21 10*3/uL — ABNORMAL LOW (ref 150–330)
RBC: 3 MIL/uL — ABNORMAL LOW (ref 4.6–6.1)
RDW: 15.9 % — ABNORMAL HIGH (ref 11.6–14.4)
Seg Neut %: 74 %
WBC: 4.3 10*3/uL (ref 4.2–9.1)

## 2019-07-09 LAB — DIFF MANUAL
Bands %: 2 % (ref 0–10)
Diff Based On: 200 CELLS
React Lymph %: 1 % (ref 0.0–6.0)

## 2019-07-09 MED ORDER — OXYMETAZOLINE HCL 0.05 % NA SOLN *I*
1.0000 | Freq: Two times a day (BID) | NASAL | Status: DC | PRN
Start: 2019-07-09 — End: 2019-07-11
  Filled 2019-07-09: qty 30

## 2019-07-09 NOTE — Interdisciplinary Rounds (Signed)
Interdisciplinary Discharge Communication Note    Expected Discharge Date: 07/11/19   Discharge to: other (comment)(home hospice [facility]))    Rounding was performed on: Date: 07/09/19 at: Time: 0900    ATTENDANCE  APP, SW, CC, Charge RN, PT, Attending, Resident, Other (comment)(palliative care team and [facility] attending)    Admit Date/Time:  07/02/2019  5:10 PM    Principal Problem:  <principal problem not specified>  The patient's problem list and interdisciplinary care plan was reviewed.    Discharge Planning    Barriers to Discharge  other (commnent) (waiting for decision from [facility] if they will accept patient with PCA)    Ongoing Issues: radiation weds- then home hospice   Geographic Patient: Yes    [place], Choiced?: Yes Patient home care agency[facility]    Medical Equipment Needs  Current Home Equipment: Hospital bed   Medical Supplies Available: To be delivered at D/C (comment) Equipment/Supplies Ordered: None       Delivery of D/C DME/medical supplies confirmed, if applicable: Not Complete    Teaching Needs  Teaching Needs: N/A        Current PCP:  Margurite Auerbach, MD      Scheduled Future Appointments:   Future Appointments    Tuesday July 16, 2019  9:00 AM  FOLLOW UP VISIT with Dietrich Pates, [facility] (--) [phone]   Tuesday July 16, 2019 10:00 AM  CHEMOTHERAPY with [facility], POD F  [place] (--) [phone]   Tuesday July 16, 2019 10:00 AM  FOLLOW UP VISIT with Waldo Laine, [facility] (--) [phone]   Wednesday August 07, 2019  9:00 AM  FOLLOW UP VISIT with Lovie Chol, MD  [facility] (--) [phone]   Wednesday August 07, 2019 10:00 AM  CHEMOTHERAPY with [facility], POD [facility]  [place] (--) [place], RN  11:08 AM

## 2019-07-09 NOTE — Progress Notes (Addendum)
Palliative Care Inpatient Progress Note     LOS: 7 days     Subjective:   Chart reviewed; events noted. No overnight events.     Continues with thrombocytopenia; plt count down to 21 today and XRT remains on hold. Richard Weaver agrees that he would rather be home and not continue to pursue any further XRT. Richard Weaver is eager to go home Wednesday or Thursday. Awaiting on final discharge plan from Richard Weaver reports improved sleep and less anxiety with HS dose of lorazepam.     Richard Weaver is eating 50-75% of meals, last BM 10/19    Comfort meds/24 hours:   Hydromorphone PCA 0.5 mg/hr with 0.5 mg q 15 minutes: 39 mg used, 79 demands, 55 deliveries.   Lorazepam 0.5 mg PO nightly     History limited by patient condition/circumstance.      Palliative Care Review of Systems:  Pain   Mild  Nausea   None  Anorexia   Mild  Anxiety   Mild  Shortness of Breath   Mild  Tiredness/Fatigue   Moderate  Airway Secretions   thin yellow mucous   Constipation   no      Physical Exam:  Exam is limited but as noted.    General Exam: frail, cachetic male, in bed, NAD   Mental State: pleasant, mood and affect at baseline.     Assessment/Plan:  This is a case ofsevere cancer related pain, anxiety, FTT, severe protein calorie malnutritionin a 73 y.o.malewith advanced lung cancer, plan for home hospice after XRT completed next week.     Symptom Management:   Pain/dyspnea: Richard Weaver is happy with his current PCA settings and he does not feel like any changes need to be made. Per [place], please D/c IV and change to SQ set up.     Anxiety: please continue lorazepam at HS and prn       Other Recommendations: My attending spoke with Richard Ng, RN at [facility]. We should be all set with PCA plan for home. Hospice to speak with Update and order PCA for discharge.     Continue to support Richard Weaver and [name].         DNR/DNI     Care coordination: Hospice team, [place] team     D/C Plan: home with hospice Thursday am     My total patient care time on the  unit >25'; >50% counseling and coordination of care.    Signed: Sherrie Mustache, NP on 07/09/2019 at 9:43 AM  Palliative Care Office: [phone]   Palliative Care Fax: [phone]   Pager [phone] ID# [id number]  Operator [phone]

## 2019-07-09 NOTE — Progress Notes (Signed)
[facility]:      Spoke with Maudie Mercury from [place] from [facility]. Maudie Mercury states they are able to accept patient on Thursday afternoon 10/22 after PCA is set up. Primary team will need to coordinate home PCA, [place] will then [facility].           [name], RN   Hospice Evaluator   Personal cell: [phone]  Office: [phone]  After hours: [phone]

## 2019-07-09 NOTE — Progress Notes (Addendum)
[facility]:    Spoke multiple members of the [facility] hospice team this morning regarding Mr. Headrick's discharge. Previously we had received confirmation last Friday [facility] would accomodate a home PCA pump. After speaking with Maudie Mercury, admission care coordinator, Maudie Mercury states the team does not usually do home PCA pumps and is requesting a list of all medication tried prior to initiating a PCA. Coordinated with Micheline Rough who plans to print Palliative care note and fax to [facility] [phone]). Writer was put into contact with [name], RN with [facility], she states Mr. Cliff's case needs to be discussed with their Clinical Leadership Team before they accept him as a patient. Annette plans to get back to me this afternoon with a final answer of whether or not they are able to accept him as a patient with a PCA.      [name], RN   Hospice Evaluator   Personal cell: [phone]  Office: [phone]  After hours: [phone]

## 2019-07-09 NOTE — Progress Notes (Signed)
[facility] Inpatient Progress Note  Length of stay: 7    Active Problems  1) Cancer-related pain in setting of Stage 3 NSCLC  2) Pancytopenia  3) Failure to Thrive  4) Postobstructive PNA    Interval Events/Subjective     Interval Events/Subjective:  No acute overnight events  Doing well this AM. Denies other concerns at this time. Wishes to go home if no further XRT will be done    ROS: Denies Fever, Chills, HA, CP, Palpitations, SOB, abd pain, N/V/D    Objective     Vitals : Blood pressure 132/72, pulse 87, temperature 35.8 C (96.4 F), temperature source Temporal, resp. rate 18, height 1.626 m ('5\' 4"'$ ), weight (!) 44 kg (97 lb), SpO2 96 %.  Vitals:    07/09/19 0731   BP: 132/72   Pulse: 87   Resp: 18   Temp: 35.8 C (96.4 F)   Weight:    Height:     BP: (110-143)/(54-88)   Temp:  [35.8 C (96.4 F)-37.1 C (98.8 F)]   Temp src: Temporal (10/20 0731)  Heart Rate:  [74-104]   Resp:  [18]   SpO2:  [95 %-99 %]      Physical exam  Gen: Cachetic Frail male. Comfortable  CVS: Normal rate, regular rhythm, physiologic s1/s2, no M/R/G  Pulm: Ronchorous sound sin LUL otherwise CTA. Prolong expiratory phase  Abd: Soft and nontender, non-distended, BS present     Labs and imaging reviewed and at baseline for pateint.  Notable findings highlighted in assessment/plan below.    Assessment/Plan   50 male with Stage 3 NSCLC on chemotherapy (T3/T4 invasion) admitted for the management of uncontrolled pain  and failure to thrive. Stable w/ adequate pain control pending d/c on home hospice.    # Cancer-related pain in setting of Stage 3 NSCLC: CT scan demonstrating a mass in theLUL that measures 43.85m x 63.868m with concomitant PET aviditywithSUV max 14 which was eroding the T3, T4 vertebraeand posterior ribs 3-5. S/p 1 cycle chemotherapy  -  Plan to d/c chemo and transition to hospice  -  Palliative Radiation, Plan for 5d. Held in setting of thrombocytopenia, needs PLT >50k for therapy. Rad Once Following  -   Palliative care involved; Continue with PCA as per palliative care. Mirilax + Dulolax    # Pancytopenia 2/2 Chemotherapy: S/p Nupgen x3. Hct/Hb 8.2/26, Plt 21.  WBC 4.3  -  HOLD anticoag; platelet transfusion if platelets fall below 20    # Failure to Thrive:  -  Nutrition following:     Resolving Issues:  # Post obstructive PNA Symbicort, Supplemental O2 PRN. Goal O2 >88%  - Zosyn Last day 10/20    Chronic Issue:  # COPD s/p excerbation: Symbicort, Supplemental O2 PRN. Goal O2 >88%    PPx/other:   - VTE: N/a in setting of thrombocytopenia   - PUD: Protonix   Diet: Dietary nutrition supplements adult:  Diet regular     Code Status: DNR/DNI     Dispo: Home Hospice tomorrow or thursday    DeBurnard BuntingMD  Family Medicine Resident  07/09/19   7:42 AM   PIC [phone]

## 2019-07-10 MED ORDER — HYDROMORPHONE HCL 2 MG/ML IJ SOLN *WRAPPED*
0.5000 mg | INTRAMUSCULAR | Status: DC | PRN
Start: 2019-07-10 — End: 2019-07-10

## 2019-07-10 MED ORDER — HYDROMORPHONE HCL 2 MG/ML IJ SOLN *WRAPPED*
0.5000 mg | INTRAMUSCULAR | 0 refills | Status: AC | PRN
Start: 2019-07-10 — End: ?

## 2019-07-10 MED ORDER — LORAZEPAM 0.5 MG PO TABS *I*
0.5000 mg | ORAL_TABLET | Freq: Every evening | ORAL | Status: DC
Start: 2019-07-10 — End: 2019-07-11
  Administered 2019-07-10: 0.5 mg via ORAL
  Filled 2019-07-10: qty 1

## 2019-07-10 MED ORDER — HYDROMORPHONE HCL 2 MG/ML IJ SOLN *WRAPPED*
0.5000 mg | INTRAMUSCULAR | 0 refills | Status: AC
Start: 2019-07-10 — End: 2019-07-13

## 2019-07-10 MED ORDER — HYDROMORPHONE HCL 2 MG/ML IJ SOLN *WRAPPED*
0.5000 mg | INTRAMUSCULAR | 0 refills | Status: DC
Start: 2019-07-10 — End: 2019-07-10

## 2019-07-10 MED ORDER — HYDROMORPHONE HCL 2 MG/ML IJ SOLN *WRAPPED*
0.5000 mg | INTRAMUSCULAR | Status: DC
Start: 2019-07-10 — End: 2019-07-10

## 2019-07-10 NOTE — Progress Notes (Signed)
[facility] Inpatient Progress Note  Length of stay: 8    Active Problems  1) Cancer-related pain in setting of Stage 3 NSCLC  2) Pancytopenia  3) Failure to Thrive  4) Postobstructive PNA    Interval Events/Subjective     Interval Events/Subjective:  No acute overnight events. Comfortable with pain well controlled. States food is tasting better.  Plan for Home is Thursday AM/PM    ROS: Denies Fever, Chills, HA, CP, Palpitations, SOB, abd pain, N/V/D    Objective     Vitals : Blood pressure 130/70, pulse 105, temperature 36.5 C (97.7 F), temperature source Temporal, resp. rate 16, height 1.626 m ('5\' 4"'$ ), weight (!) 44 kg (97 lb), SpO2 95 %.  Vitals:    07/10/19 0250   BP:    Pulse:    Resp: 16   Temp:    Weight:    Height:     BP: (130)/(70)   Temp:  [36.5 C (97.7 F)]   Temp src: Temporal (10/20 1152)  Heart Rate:  [105]   Resp:  [16-18]   SpO2:  [95 %]      Physical exam  Cachetic Frail male. Comfortable, Appears warm and well perfused. Non labored breathing    Labs and imaging reviewed and at baseline for pateint.  Notable findings highlighted in assessment/plan below.    Assessment/Plan   14 male with Stage 3 NSCLC on chemotherapy (T3/T4 invasion) admitted for the management of uncontrolled pain  and failure to thrive. Stable w/ adequate pain control, plan for d/c on home hospice.    # Cancer-related pain in setting of Stage 3 NSCLC: CT scan demonstrating a mass in theLUL that measures 43.63m x 63.862m with concomitant PET aviditywithSUV max 14 which was eroding the T3, T4 vertebraeand posterior ribs 3-5. S/p 1 cycle chemotherapy.  -  Plan to d/c chemo and transition to hospice  -  No additional Palliative Radiation indicated given thrombocytopenia.  -  Palliative care involved; Continue with PCA as per palliative care. Mirilax + Dulolax    # Pancytopenia 2/2 Chemotherapy: Labs have been d/ced. Pt pending home dispo on hospice    # Failure to Thrive: Nutrition following    Resolved Issues:  # Post  obstructive PNA Symbicort, Supplemental O2 PRN. Goal O2 >88%; Zosyn Last day 10/20    Chronic Issue:  # COPD s/p excerbation: Symbicort, Supplemental O2 PRN. Goal O2 >88%    PPx/other:   - VTE: N/a in setting of thrombocytopenia   - PUD: Protonix   Diet: Dietary nutrition supplements adult:  Diet regular     Code Status: DNR/DNI     Dispo: Home Hospice tomorrow or thursday    DeBurnard BuntingMD  Family Medicine Resident  07/10/19   8:52 AM   PIC [phone]

## 2019-07-10 NOTE — Progress Notes (Addendum)
[facility]:      Spoke with [facility] coordinator Maudie Mercury in regards to patients PCA. Provided Maudie Mercury with [facility] number [phone]) as the [facility] hospice team needs to coordinate with [facility] for a "PCA Contract" for their Hospice Agency.         [name], RN   Hospice Evaluator   Personal cell: [phone]  Office: [phone]  After hours: [phone]

## 2019-07-10 NOTE — Progress Notes (Signed)
[facility]:      Spoke with [place] who plan to come to [facility] tomorrow between 10/11am to set up home PCA pump. Patient will be transported home by his wife [name]. [facility] would need patient to be in his home by 2pm in order to open him to hospice.         [name], RN   Hospice Evaluator   Personal cell: [phone]  Office: [phone]  After hours: [phone]

## 2019-07-10 NOTE — Progress Notes (Signed)
07/10/19 1400   [facility] Patient Class Review   Patient Class Review Inpatient     Patient Class effective 07/02/19 @ [place], BSN RN  [phone]  Pager [phone]  Utilization Management

## 2019-07-10 NOTE — Interdisciplinary Rounds (Signed)
Interdisciplinary Discharge Communication Note    Expected Discharge Date: 07/11/19   Discharge to: other (comment)(home hospice [facility]))    Rounding was performed on: Date: 07/10/19 at: Time: [place], APP, Charge RN, CC, Diplomatic Services operational officer, Attending    Admit Date/Time:  07/02/2019  5:10 PM    Principal Problem:  <principal problem not specified>  The patient's problem list and interdisciplinary care plan was reviewed.    Discharge Planning    Barriers to Discharge  other (commnent) (waiting for decision from [facility] if they will accept patient with PCA)    Ongoing Issues: radiation weds- then home hospice   Geographic Patient: Yes    [place], Choiced?: Yes Patient home care agency[facility]    Medical Equipment Needs  Current Home Equipment: Hospital bed   Medical Supplies Available: To be delivered at D/C (comment) Equipment/Supplies Ordered: None       Delivery of D/C DME/medical supplies confirmed, if applicable: Not Complete    Teaching Needs  Teaching Needs: N/A        Current PCP:  Margurite Auerbach, MD      Scheduled Future Appointments:   Future Appointments    Tuesday July 16, 2019 10:00 AM  FOLLOW UP VISIT with Waldo Laine, [facility] (--) [phone]          PLAN  Transportation                Nicoletta Ba, RN  10:39 AM

## 2019-07-10 NOTE — Progress Notes (Signed)
Palliative Care Inpatient Progress Note     LOS: 8 days     Subjective:   Chart reviewed; events noted. No overnight events.   Uncomfortable this morning but tells writer it is just "because I'm not home." He is pleased with current comfort regimen and does not want any changes made prior to discharge.     Comfort meds/24 hours:   Hydromorphone PCA 0.5 mg/hr with 0.5 mg q 15 minutes: 39 mg used, 59 demands, 55 deliveries.   Lorazepam 0.5 mg PO nightly     History limited by patient condition/circumstance.      Palliative Care Review of Systems:  Pain   Mild  Nausea   None  Anorexia   Mild  Anxiety   Mild  Shortness of Breath   Mild  Tiredness/Fatigue   Moderate  Airway Secretions   expectorating thin yellow mucous  Constipation   no      Physical Exam:  Exam is limited but as noted.    General Exam: frail, cachetic male, in bed, NAD   Mental State: pleasant, mood and affect at baseline.     Assessment/Plan:  This is a case ofsevere cancer related pain, anxiety, FTT, severe protein calorie malnutritionin a 73 y.o.malewith advanced lung cancer, plan for home hospice after XRT completed next week.     Symptom Management:   Pain/dyspnea: Richard Weaver is happy with his current PCA settings and he does not feel like any changes need to be made. Per hospice, plan for [facility] to come to [facility] tomorrow set up home PCA prior to discharge. Richard Weaver and Richard Weaver updated.     Anxiety: please continue lorazepam at HS and prn       Other Recommendations: Continue to support Richard Weaver and Richard Weaver.     DNR/DNI     Care coordination: Hospice team, [place] team     D/C Plan: home with hospice Thursday am     My total patient care time on the unit >25'; >50% counseling and coordination of care.    Signed: Sherrie Mustache, NP on 07/10/2019 at 2:34 PM  Palliative Care Office: [place] Fax: [phone]   Pager [phone] ID# [id number]  Operator [phone]

## 2019-07-11 ENCOUNTER — Telehealth: Payer: Self-pay

## 2019-07-11 MED ORDER — POLYETHYLENE GLYCOL 3350 PO PACK 17 GM *I*
17.0000 g | PACK | Freq: Every day | ORAL | 0 refills | Status: AC
Start: 2019-07-11 — End: ?

## 2019-07-11 MED ORDER — MELATONIN 3 MG PO TABS *I*
3.0000 mg | ORAL_TABLET | Freq: Every evening | ORAL | 0 refills | Status: AC | PRN
Start: 2019-07-11 — End: ?

## 2019-07-11 MED ORDER — HALOPERIDOL 0.5 MG PO TABS *I*
0.5000 mg | ORAL_TABLET | ORAL | 0 refills | Status: AC | PRN
Start: 2019-07-11 — End: ?

## 2019-07-11 MED ORDER — BISACODYL 10 MG RE SUPP *I*
10.0000 mg | Freq: Every day | RECTAL | 0 refills | Status: AC | PRN
Start: 2019-07-11 — End: ?

## 2019-07-11 MED ORDER — LORAZEPAM 0.5 MG PO TABS *I*
ORAL_TABLET | ORAL | 0 refills | Status: AC
Start: 2019-07-11 — End: ?

## 2019-07-11 MED ORDER — ACETAMINOPHEN 650 MG RE SUPP *I*
650.0000 mg | RECTAL | 0 refills | Status: AC | PRN
Start: 2019-07-11 — End: ?

## 2019-07-11 MED ORDER — ACETAMINOPHEN 325 MG PO TABS *I*
650.0000 mg | ORAL_TABLET | Freq: Four times a day (QID) | ORAL | Status: AC | PRN
Start: 2019-07-11 — End: ?

## 2019-07-11 MED ORDER — HYOSCYAMINE SULFATE 0.125 MG SL SUBL *I*
0.1250 mg | SUBLINGUAL_TABLET | SUBLINGUAL | 0 refills | Status: AC | PRN
Start: 2019-07-11 — End: ?

## 2019-07-11 NOTE — Discharge Summary (Signed)
Name: Richard Weaver MRN: [id number] DOB: [birth year]     Admit Date: 07/02/2019   Date of Discharge: 07/11/2019     Patient was accepted for discharge to   Home or Self Care [1]           Discharge Attending Physician: Charlestine Night, MD      Hospitalization Summary    CONCISE NARRATIVE:   73yo M with locally advanced lung cancer invading into spine at T3-4 presents with complaints of uncontrolled back pain. He was admitted for pain control, evaluated by [facility] and rad/onc. Placed on a hydromorphone PCA.    Also complained of worsening dyspnea and cough with yellow sputum. Chest xray notable for possible post-obstructive pna. Required supplemental oxygen.  Placed on Zosyn.      Had a discussion with PC and ultimately decided to pursue hospice at home. He underwent 5 fractions of XRT and was discharged on home hospice with pca on 07/11/19                   XRAY RESULTS: Chest:Worsening airspace opacity in the left upper lobe. This likely relates to the patient's neoplasm plus or minus a postobstructive process which could relate to pneumonia or atelectasis.          CONSULTANT SERVICE     Pain Medicine     Radiation Oncology        SPECIALITY INFO:   *Please Do Not [place] Service  This patient was seen by Palliative Care at [facility].  Please see initial consult note on date 10/14 for further details of goals of care.   Primary PC Diagnosis: cancer associated pain, lung cancer   '[]'$  MOLST completed this Hospitalization  '[]'$  MOLST is in [employer] Tab    Palliative Care Office: [phone]  Fax: [phone]  On-call: [phone]            Signed: Everett Graff, PA  On: 07/11/2019  at: 3:07 PM

## 2019-07-11 NOTE — Interdisciplinary Rounds (Signed)
Interdisciplinary Discharge Communication Note    Expected Discharge Date: 07/11/19   Discharge to: other (comment)(Home with [facility])    Rounding was performed on: Date: 07/11/19 at: Time: 0900    ATTENDANCE  APP, SW, CC, Charge RN, PT, Attending, Resident, Other (comment)(palliative care team and [facility] attending)    Admit Date/Time:  07/02/2019  5:10 PM    Principal Problem:  <principal problem not specified>  The patient's problem list and interdisciplinary care plan was reviewed.    Discharge Planning    Barriers to Discharge  all prior barriers have been addressed/resolved    Ongoing Issues: radiation weds- then home hospice  Ready for Discharge: YesGeographic Patient: Yes    [place], Choiced?: Yes Patient home care agency: [facility]    Medical Equipment Needs  Current Home Equipment: None   Medical Supplies Available: Not Applicable Equipment/Supplies Ordered: None       Delivery of D/C DME/medical supplies confirmed, if applicable: Not Applicable    Teaching Needs  Teaching Needs: N/A        Current PCP:  Margurite Auerbach, MD   Other Follow-Up Appointments: N/A  Scheduled Future Appointments:   Future Appointments    Tuesday July 16, 2019 10:00 AM  FOLLOW UP VISIT with Waldo Laine, [facility] (--) [phone]          PLAN  Transportation  Transportation Setup: N/A   SW Assistance for Transportation: N/A    Prescriptions Sent  Prescriptions sent to outpatient pharmacy: N/A  SW Assistance for Pharmacy: N/A    Williams Che, RN  11:40 AM

## 2019-07-11 NOTE — Progress Notes (Signed)
[facility] Inpatient Progress Note  Length of stay: 9    Active Problems  1) Cancer-related pain in setting of Stage 3 NSCLC  2) Pancytopenia  3) Failure to Thrive  4) Postobstructive PNA    Interval Events/Subjective     Interval Events/Subjective:  No acute overnight events. Comfortable with pain well controlled. Ready for d/c home today    ROS: Denies Fever, Chills, HA, CP, Palpitations, SOB, abd pain, N/V/D    Objective     Vitals : Blood pressure 130/70, pulse 105, temperature 36.5 C (97.7 F), temperature source Temporal, resp. rate 16, height 1.626 m ('5\' 4"'$ ), weight (!) 44 kg (97 lb), SpO2 95 %.  Vitals:    07/10/19 0250   BP:    Pulse:    Resp: 16   Temp:    Weight:    Height:           Physical exam  Cachetic Frail male. Comfortable, Appears warm and well perfused. Non labored breathing    Labs and imaging reviewed and at baseline for pateint.  Notable findings highlighted in assessment/plan below.    Assessment/Plan   59 male with Stage 3 NSCLC on chemotherapy (T3/T4 invasion) admitted for the management of uncontrolled pain  and failure to thrive. Stable w/ adequate pain control, plan for d/c on home hospice today.    # Cancer-related pain in setting of Stage 3 NSCLC: CT scan demonstrating a mass in theLUL that measures 43.41m x 63.832m with concomitant PET aviditywithSUV max 14 which was eroding the T3, T4 vertebraeand posterior ribs 3-5. S/p 1 cycle chemotherapy.  -  Plan to d/c chemo and transition to hospice  -  No additional Palliative Radiation indicated given thrombocytopenia.  -  Palliative care involved; Continue with PCA as per palliative care. Mirilax + Dulolax    # Pancytopenia 2/2 Chemotherapy: Labs have been d/ced. Pt pending home dispo on hospice    # Failure to Thrive: Nutrition following    Resolved Issues:  # Post obstructive PNA Symbicort, Supplemental O2 PRN. Goal O2 >88%; Zosyn Last day 10/20    Chronic Issue:  # COPD s/p excerbation: Symbicort, Supplemental O2 PRN. Goal  O2 >88%    PPx/other:   - VTE: N/a in setting of thrombocytopenia   - PUD: Protonix   Diet: Dietary nutrition supplements adult:  Diet regular     Code Status: DNR/DNI     Dispo: Home Hospice tomorrow or thursday    DeBurnard BuntingMD  Family Medicine Resident  07/11/19   8:00 AM   [facility]IC [phone]

## 2019-07-11 NOTE — Plan of Care (Signed)
Problem: Pain/Comfort  Goal: Patient's pain or discomfort is manageable  Outcome: Maintaining     Problem: Mobility  Goal: Patient's functional status is maintained or improved  Outcome: Maintaining     Problem: Nutrition  Goal: Patient's nutritional is maintained  Outcome: Maintaining     Problem: Psychosocial  Goal: Demonstrates ability to cope with illness  Outcome: Maintaining     Problem: Safety  Goal: Patient will remain free from falls  Outcome: Maintaining     Pt reported minimal pain overnight. Up with standby assist, calls appropriately for help getting OOB. PCA button in reach. Bed alarm on.    Orson Aloe RN

## 2019-07-11 NOTE — Progress Notes (Signed)
O2 sat 96 on room air at rest.

## 2019-07-11 NOTE — Plan of Care (Signed)
Discharge instructions reviewed with pt and his wife, medication education completed and all questions answered. Pt discharged.

## 2019-07-11 NOTE — Discharge Instructions (Signed)
Brief Summary of Your Hospital Course (including key procedures and diagnostic test results):  You were admitted for back pain from cancer in your thoracic spine.  A pain doctor saw you and made adjustments to your pain medications. You were placed on a pain pump. You also saw a radiation doctor and had radiation to the spine.     You decided to pursue home hospice.      Your instructions:  You will have home hospice     New Medications:  Dilaudid PCA  Ativan as needed  Haldol as needed  Stool softeners     Changes to existing medications:  See medication list for changes.     Recommended diet: as before     Recommended activity: activity as tolerated    If you experience any of these symptoms within the first 24 hours after discharge:Uncontrolled pain  please follow up with the discharge attending Dr. Zenia Resides at phone-number: [phone]    If you experience any of these symptoms 24 hours or more after discharge:Uncontrolled pain  please follow up with your PCP:  Margurite Auerbach, MD [phone]

## 2019-07-11 NOTE — Progress Notes (Signed)
[facility]:    Writer was notified this morning patient requires an oxygen concentrator at home for discharge. Call to [place] who referred writer to call [employer] and request an oxygen concentrator to be delivered to patient home ASAP. Call to [employer], spoke with Anderson Malta who confirmed an oxygen concentrator will be delivered to patients home this afternoon. As there is not enough time for a portable oxygen concentrator to be delivered to the hospital, staff RN Clarise Cruz took patients oxygen concentration on room air, patient O2 Sat 96 on room air, therefore patient will be ok on room air to drive home with wife as oxygen concentrator will be delivered to home for patient to hook up to this afternoon.     Received a call from [place], [place] from [place] stating they have not received the Contract from [place]. Writer called [employer], [employer] with [facility] to notify Denna Haggard has not received the contract. [name] contacted Olivia Mackie from the finance department. Olivia Mackie re-faxed the contract and called [facility] to confirm it was received. [facility] now plans to come to [place] between 12-12:30pm today to set up PCA.     Coordinated with staff RN Clarise Cruz to ask if she is able to initiate discharge paperwork prior to PCA set up as patient needs to be discharged from the hospital ASAP after PCA set up since [facility] is requesting he is home prior to [place], [place] cell: [phone]  Office: [phone]  After hours: [phone]

## 2019-07-12 ENCOUNTER — Ambulatory Visit: Payer: Medicare (Managed Care) | Admitting: Radiology

## 2019-07-12 LAB — LEGIONELLA CULTURE: Legionella Culture: 0

## 2019-07-16 ENCOUNTER — Encounter: Payer: Medicare (Managed Care) | Admitting: Radiation Oncology

## 2019-07-16 ENCOUNTER — Ambulatory Visit: Payer: Medicare (Managed Care)

## 2019-07-16 ENCOUNTER — Ambulatory Visit: Payer: Medicare (Managed Care) | Admitting: Oncology

## 2019-07-18 ENCOUNTER — Ambulatory Visit: Payer: Medicare (Managed Care) | Admitting: Hematology & Oncology

## 2019-07-19 ENCOUNTER — Ambulatory Visit: Payer: Self-pay | Admitting: Student in an Organized Health Care Education/Training Program

## 2019-07-26 NOTE — Progress Notes (Addendum)
Radiation Oncology End of Treatment Summary    Patient Name: Richard Weaver  Patient DOB: 08/17/1946  Patient MRN: [id number]    Patient ID: [name] is a 73 y.o. 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    He was treated with external beam radiation therapy as described below.    Site Treated: L Lung    Treatment Dates: 10/15 - 10/16    Concurrent Chemotherapy: No    Attending Radiation Oncologist: Dr. Roosevelt Locks    Delivered Treatment:    Machine Plan ID  [employer]  Dose / Fx (cGy)  Fractions  Total Dose  (cGy)     [id number]_[id number]_Lung 3D 16x 400 2 800     Field Design:        Treatment Summary: The patient underwent CT-based treatment planning and was placed on the simulator couch and immobilized using a customized immobilization device allowing for reproducible positioning. After the target volume and organs at risk were delineated on the treatment planning CT, the patient was planned using our treatment planning system. Beams were shaped with MLCs to improve conformality of target coverage.   A 3D conformal planning approach was utilized using dynamic arcs static fields.     Treatment Course: The patient did fine with radiation, this was palliative treatment. He chose to leave the hospital and go home on hospice with PCA for pain control. Radiation was held at 2/5 fractions.     Disposition: We are available to see [facility] as needed. He is welcome to contact us with issues or questions at any time.    Thank you for allowing [place] to participate in the care of this patient.    Kathrin Penner MD  Radiation Oncology PGY 2

## 2019-07-31 ENCOUNTER — Other Ambulatory Visit: Payer: Medicare (Managed Care)

## 2019-08-07 ENCOUNTER — Ambulatory Visit: Payer: Medicare (Managed Care) | Admitting: Hematology & Oncology

## 2019-08-07 ENCOUNTER — Ambulatory Visit: Payer: Medicare (Managed Care)

## 2019-08-27 ENCOUNTER — Ambulatory Visit: Payer: Medicare (Managed Care) | Admitting: Oncology

## 2019-08-27 ENCOUNTER — Ambulatory Visit: Payer: Medicare (Managed Care)

## 2019-09-04 NOTE — Telephone Encounter (Signed)
No follow up required.
